# Patient Record
Sex: Female | Born: 1965 | Race: White | Hispanic: No | Marital: Married | Smoking: Current every day smoker
Health system: Southern US, Community
[De-identification: ages and names within clinical notes are randomized; demographics above are authoritative.]

## PROBLEM LIST (undated history)

## (undated) DIAGNOSIS — N2881 Hypertrophy of kidney: Secondary | ICD-10-CM

## (undated) DIAGNOSIS — N200 Calculus of kidney: Secondary | ICD-10-CM

## (undated) HISTORY — PX: OTHER SURGICAL HISTORY: SHX169

---

## 1999-11-27 ENCOUNTER — Encounter: Payer: Self-pay | Admitting: Emergency Medicine

## 1999-11-27 ENCOUNTER — Emergency Department (HOSPITAL_COMMUNITY): Admission: EM | Admit: 1999-11-27 | Discharge: 1999-11-27 | Payer: Self-pay | Admitting: Emergency Medicine

## 2000-05-31 ENCOUNTER — Other Ambulatory Visit: Admission: RE | Admit: 2000-05-31 | Discharge: 2000-05-31 | Payer: Self-pay | Admitting: Unknown Physician Specialty

## 2000-06-04 ENCOUNTER — Encounter: Payer: Self-pay | Admitting: *Deleted

## 2000-06-04 ENCOUNTER — Encounter: Admission: RE | Admit: 2000-06-04 | Discharge: 2000-06-04 | Payer: Self-pay | Admitting: *Deleted

## 2000-07-21 ENCOUNTER — Emergency Department (HOSPITAL_COMMUNITY): Admission: EM | Admit: 2000-07-21 | Discharge: 2000-07-21 | Payer: Self-pay | Admitting: Emergency Medicine

## 2000-08-19 ENCOUNTER — Emergency Department (HOSPITAL_COMMUNITY): Admission: EM | Admit: 2000-08-19 | Discharge: 2000-08-20 | Payer: Self-pay | Admitting: Emergency Medicine

## 2001-09-11 ENCOUNTER — Emergency Department (HOSPITAL_COMMUNITY): Admission: EM | Admit: 2001-09-11 | Discharge: 2001-09-11 | Payer: Self-pay | Admitting: Emergency Medicine

## 2001-09-11 ENCOUNTER — Encounter: Payer: Self-pay | Admitting: Emergency Medicine

## 2002-03-07 ENCOUNTER — Inpatient Hospital Stay (HOSPITAL_COMMUNITY): Admission: AD | Admit: 2002-03-07 | Discharge: 2002-03-07 | Payer: Self-pay | Admitting: *Deleted

## 2004-07-24 ENCOUNTER — Emergency Department (HOSPITAL_COMMUNITY): Admission: EM | Admit: 2004-07-24 | Discharge: 2004-07-24 | Payer: Self-pay | Admitting: Emergency Medicine

## 2004-12-28 ENCOUNTER — Emergency Department (HOSPITAL_COMMUNITY): Admission: EM | Admit: 2004-12-28 | Discharge: 2004-12-28 | Payer: Self-pay | Admitting: Family Medicine

## 2005-12-17 ENCOUNTER — Emergency Department (HOSPITAL_COMMUNITY): Admission: EM | Admit: 2005-12-17 | Discharge: 2005-12-17 | Payer: Self-pay | Admitting: Family Medicine

## 2006-09-17 ENCOUNTER — Emergency Department (HOSPITAL_COMMUNITY): Admission: EM | Admit: 2006-09-17 | Discharge: 2006-09-17 | Payer: Self-pay | Admitting: Emergency Medicine

## 2008-06-20 ENCOUNTER — Emergency Department (HOSPITAL_COMMUNITY): Admission: EM | Admit: 2008-06-20 | Discharge: 2008-06-20 | Payer: Self-pay | Admitting: Emergency Medicine

## 2009-02-09 ENCOUNTER — Emergency Department (HOSPITAL_COMMUNITY): Admission: EM | Admit: 2009-02-09 | Discharge: 2009-02-10 | Payer: Self-pay | Admitting: Emergency Medicine

## 2009-02-18 ENCOUNTER — Ambulatory Visit (HOSPITAL_BASED_OUTPATIENT_CLINIC_OR_DEPARTMENT_OTHER): Admission: RE | Admit: 2009-02-18 | Discharge: 2009-02-18 | Payer: Self-pay | Admitting: Orthopedic Surgery

## 2009-04-06 ENCOUNTER — Encounter: Admission: RE | Admit: 2009-04-06 | Discharge: 2009-05-13 | Payer: Self-pay | Admitting: Orthopedic Surgery

## 2009-05-14 ENCOUNTER — Encounter: Admission: RE | Admit: 2009-05-14 | Discharge: 2009-06-14 | Payer: Self-pay | Admitting: Orthopedic Surgery

## 2009-12-07 ENCOUNTER — Emergency Department (HOSPITAL_COMMUNITY): Admission: EM | Admit: 2009-12-07 | Discharge: 2009-12-07 | Payer: Self-pay | Admitting: Emergency Medicine

## 2010-03-11 ENCOUNTER — Emergency Department (HOSPITAL_COMMUNITY): Admission: EM | Admit: 2010-03-11 | Discharge: 2010-03-11 | Payer: Self-pay | Admitting: Family Medicine

## 2010-11-01 LAB — POCT PREGNANCY, URINE: Preg Test, Ur: NEGATIVE

## 2010-11-01 LAB — POCT I-STAT, CHEM 8: Potassium: 3.5 mEq/L (ref 3.5–5.1)

## 2010-11-21 LAB — CBC
MCV: 90.2 fL (ref 78.0–100.0)
RDW: 13.9 % (ref 11.5–15.5)

## 2010-11-21 LAB — DIFFERENTIAL
Basophils Relative: 1 % (ref 0–1)
Eosinophils Absolute: 0.1 10*3/uL (ref 0.0–0.7)
Lymphocytes Relative: 41 % (ref 12–46)
Monocytes Absolute: 0.3 10*3/uL (ref 0.1–1.0)
Monocytes Relative: 5 % (ref 3–12)

## 2010-11-21 LAB — POCT I-STAT, CHEM 8
BUN: 7 mg/dL (ref 6–23)
Calcium, Ion: 0.98 mmol/L — ABNORMAL LOW (ref 1.12–1.32)
Creatinine, Ser: 0.9 mg/dL (ref 0.4–1.2)
HCT: 37 % (ref 36.0–46.0)
Hemoglobin: 12.6 g/dL (ref 12.0–15.0)
Potassium: 3.2 mEq/L — ABNORMAL LOW (ref 3.5–5.1)
Sodium: 133 mEq/L — ABNORMAL LOW (ref 135–145)
TCO2: 21 mmol/L (ref 0–100)

## 2010-11-21 LAB — TYPE AND SCREEN: Antibody Screen: NEGATIVE

## 2010-12-27 NOTE — Consult Note (Signed)
NAME:  Jeanne Huynh, Jeanne Huynh NO.:  [id number]   MEDICAL RECORD NO.:  [id number]          PATIENT TYPE:  EMS   LOCATION:  MAJO                         FACILITY:  [facility]   PHYSICIAN:  Madelynn Done, MD  DATE OF BIRTH:  [birth year]   DATE OF CONSULTATION:  06/20/2008  DATE OF DISCHARGE:  06/20/2008                                 CONSULTATION   REASON FOR CONSULTATION:  Right distal radius fracture.   REQUESTING PHYSICIAN:  Doug Sou, M.D., Emergency Department   Jeanne Huynh is a 45 year old right-hand-dominant female.  The patient  known to me as I have taken care of her daughter who fell at her  daughter's birthday party while skating and landed on an outstretched  right wrist.  The patient presented to the urgent care, was transferred  to the emergency department for definitive management of her right wrist  fracture.  No other complaints today.   PAST MEDICAL HISTORY:  Anxiety, asthma, and panic attacks.   PAST SURGICAL HISTORY:  No prior wrist surgeries.   SOCIAL HISTORY:  She is a smoker, occasional alcohol, no drug use.  She  is married.  They live in [place].   REVIEW OF SYSTEMS:  No recent illnesses or hospitalizations.   PHYSICAL EXAMINATION:  She is a healthy-appearing female.  Blood  pressure 153/95, heart rate of 77, respirations of 20 with a regular  pulse.  She has a normal gait and station.  Good hand coordination,  normal mood.  She is alert and oriented to person, place, and time in no  acute distress.   On examination of the right wrist, she does have an elevated deformity  to her right wrist.  Her skin is in good condition.  She is able to  extend her thumb A-OK sign across her fingers, extend her digits, extend  her thumb.  Her fingertips are warm and well-perfused, good capillary  refill.  She has limited motion of her wrist and forearm secondary to  the injury.   RADIOGRAPH:  Three views of the wrist did show a displaced and  comminuted distal radius fracture that appears extraarticular with the  apex volar angulation.   PROCEDURE:  After hematoma block was performed with 10 mL of 1%  lidocaine, a closed manipulation was then performed.  The patient was  then placed in a well-padded sugar-tong splint.  This was done using the  finger-trap traction and a closed manipulation was done without any  difficulty.   Postreduction radiographs did show good position of both planes of the  distal radius fracture.  There is good alignment and good restoration of  her radial height inclination and tilt.   IMPRESSION:  Right wrist distal radius fracture, displaced status post  closed manipulation.   PLAN:  The patient is going to continue with a sugar tong splint.  She  is going to see me in the office later on this week for repeat  radiographs in the splint.  If she does not show any displacement, we  will continue with the closed  treatment of this injury; however, further  displacement occurred.  The  patient is likely going to require operative intervention.  All  questions were addressed of the patient.  Today, we did write her a pain  prescription for Vicodin.  She was given a light-duty work note.  We  talked about ice, elevation, and keeping the splint clean and dry.      Madelynn Done, MD  Electronically Signed     FWO/MEDQ  D:  06/20/2008  T:  06/20/2008  Job:  [id number]

## 2010-12-27 NOTE — Op Note (Signed)
NAME:  KATRIANA, DORTCH              ACCOUNT NO.:  [id number]   MEDICAL RECORD NO.:  [id number]          PATIENT TYPE:  AMB   LOCATION:  DSC                          FACILITY:  [facility]   PHYSICIAN:  Mila Homer. Sherlean Foot, M.D. DATE OF BIRTH:  1966/04/23   DATE OF PROCEDURE:  02/18/2009  DATE OF DISCHARGE:                               OPERATIVE REPORT   SURGEON:  Mila Homer. Sherlean Foot, MD   ASSISTANT:  None.   ANESTHESIA:  General.   PREOPERATIVE DIAGNOSIS:  Right ankle trimalleolar fracture.   POSTOPERATIVE DIAGNOSIS:  Right ankle trimalleolar fracture.   PROCEDURE:  Right open reduction and internal fixation of medial and  lateral malleoli.   INDICATIONS FOR PROCEDURE:  The patient is a 45 year old white female,  who fell out of a tree a week ago.  She presented to the emergency room.  I was on an unassigned call.  She was reduced by the ER physician and  sent to my office.  This was the first opportunity to get on the  schedule.  Informed consent was obtained.   DESCRIPTION OF PROCEDURE:  The patient was laid supine and administered  general anesthesia and placed in supine position.  Right leg was prepped  and draped in the usual sterile fashion.  Esmarch was used to  exsanguinate the extremity and elevated the tourniquet 300 mmHg.  I then  made an incision along the lateral border of the fibula.  Blunt  dissection down to the bone and sharp dissection to remove the  periosteum off the bone.  Cleaned out the fracture.  Reduced the  fracture with a tack and with a bone reduction forceps.  I then placed a  lag screw anterior to posterior, 24-mm bicortical screw.  I then removed  the bone reduction forceps and maintained anatomic reduction.  I placed  a 7-hole plate fastened to the lateral malleolus.  Placed 2 distal fully  threaded cancellous screws 14 mm in length.  Approximately, I placed  three 12-mm screws, bicortical and one 14.  I checked under AP and  lateral C-arm images to  ensure anatomic reduction.  I then made a small  2-cm incision over the medial malleolus that was a fracture blister  there, tried to stay posterior to that and went down to the fracture,  placed two 2.0 K-wires, and reduced it anatomically.  I then threaded  out the K-wires for 40-mm partially threaded cancellous screws.  Mortise  was anatomic.  On the lateral, there was trimalleolar piece, which was  certainly less than 25 mm and throughout a range of motion, the ankle  stayed located.  So, I felt that it was not necessary to fix that.  I  then irrigated and closed with 0 and 2-0 Vicryl sutures, skin staples,  Xeroform, Adaptic, 4x4s, ABDs, soft dressing, and an equalizer.  I  wanted her in the equalizer, so we can take down the wound and check  that but she is to stay in the equalizer until I see her back.           ______________________________  Mila Homer.  Sherlean Foot, M.D.    SDL/MEDQ  D:  02/18/2009  T:  02/18/2009  Job:  [id number]

## 2011-01-10 ENCOUNTER — Inpatient Hospital Stay (INDEPENDENT_AMBULATORY_CARE_PROVIDER_SITE_OTHER)
Admission: RE | Admit: 2011-01-10 | Discharge: 2011-01-10 | Disposition: A | Discharge status: Home / Self Care | Payer: Self-pay | Source: Ambulatory Visit | Attending: Family Medicine | Admitting: Family Medicine

## 2011-01-10 DIAGNOSIS — H6120 Impacted cerumen, unspecified ear: Secondary | ICD-10-CM

## 2011-01-10 DIAGNOSIS — H9209 Otalgia, unspecified ear: Secondary | ICD-10-CM

## 2011-01-11 ENCOUNTER — Inpatient Hospital Stay (INDEPENDENT_AMBULATORY_CARE_PROVIDER_SITE_OTHER)
Admission: RE | Admit: 2011-01-11 | Discharge: 2011-01-11 | Disposition: A | Discharge status: Home / Self Care | Payer: Self-pay | Source: Ambulatory Visit | Attending: Family Medicine | Admitting: Family Medicine

## 2011-01-11 DIAGNOSIS — H6120 Impacted cerumen, unspecified ear: Secondary | ICD-10-CM

## 2011-01-13 ENCOUNTER — Inpatient Hospital Stay (HOSPITAL_COMMUNITY)
Admission: RE | Admit: 2011-01-13 | Discharge: 2011-01-13 | Disposition: A | Discharge status: Home / Self Care | Payer: Self-pay | Source: Ambulatory Visit | Attending: Family Medicine | Admitting: Family Medicine

## 2011-05-18 ENCOUNTER — Encounter (HOSPITAL_COMMUNITY): Payer: Self-pay | Admitting: Radiology

## 2011-05-18 ENCOUNTER — Emergency Department (HOSPITAL_COMMUNITY)
Admission: EM | Admit: 2011-05-18 | Discharge: 2011-05-18 | Disposition: A | Discharge status: Home / Self Care | Payer: Self-pay | Attending: Emergency Medicine | Admitting: Emergency Medicine

## 2011-05-18 ENCOUNTER — Emergency Department (HOSPITAL_COMMUNITY): Payer: Self-pay

## 2011-05-18 DIAGNOSIS — Z79899 Other long term (current) drug therapy: Secondary | ICD-10-CM | POA: Insufficient documentation

## 2011-05-18 DIAGNOSIS — R6883 Chills (without fever): Secondary | ICD-10-CM | POA: Insufficient documentation

## 2011-05-18 DIAGNOSIS — N23 Unspecified renal colic: Secondary | ICD-10-CM | POA: Insufficient documentation

## 2011-05-18 DIAGNOSIS — R197 Diarrhea, unspecified: Secondary | ICD-10-CM | POA: Insufficient documentation

## 2011-05-18 DIAGNOSIS — J45909 Unspecified asthma, uncomplicated: Secondary | ICD-10-CM | POA: Insufficient documentation

## 2011-05-18 DIAGNOSIS — R10814 Left lower quadrant abdominal tenderness: Secondary | ICD-10-CM | POA: Insufficient documentation

## 2011-05-18 DIAGNOSIS — R109 Unspecified abdominal pain: Secondary | ICD-10-CM | POA: Insufficient documentation

## 2011-05-18 DIAGNOSIS — N1330 Unspecified hydronephrosis: Secondary | ICD-10-CM | POA: Insufficient documentation

## 2011-05-18 DIAGNOSIS — F411 Generalized anxiety disorder: Secondary | ICD-10-CM | POA: Insufficient documentation

## 2011-05-18 LAB — POCT I-STAT, CHEM 8
Calcium, Ion: 1.14 mmol/L (ref 1.12–1.32)
Glucose, Bld: 95 mg/dL (ref 70–99)
Sodium: 137 mEq/L (ref 135–145)
TCO2: 25 mmol/L (ref 0–100)

## 2011-05-18 LAB — URINALYSIS, ROUTINE W REFLEX MICROSCOPIC
Bilirubin Urine: NEGATIVE
Ketones, ur: NEGATIVE mg/dL
Nitrite: NEGATIVE
Specific Gravity, Urine: 1.006 (ref 1.005–1.030)
pH: 6 (ref 5.0–8.0)

## 2011-05-18 LAB — URINE MICROSCOPIC-ADD ON

## 2011-05-19 LAB — URINE CULTURE
Culture  Setup Time: 201210041713
Culture: NO GROWTH

## 2012-01-19 ENCOUNTER — Encounter (HOSPITAL_COMMUNITY): Payer: Self-pay | Admitting: *Deleted

## 2012-01-19 ENCOUNTER — Emergency Department (INDEPENDENT_AMBULATORY_CARE_PROVIDER_SITE_OTHER)
Admission: EM | Admit: 2012-01-19 | Discharge: 2012-01-19 | Disposition: A | Discharge status: Home / Self Care | Payer: Self-pay | Source: Home / Self Care | Attending: Emergency Medicine | Admitting: Emergency Medicine

## 2012-01-19 DIAGNOSIS — L723 Sebaceous cyst: Secondary | ICD-10-CM

## 2012-01-19 MED ORDER — SULFAMETHOXAZOLE-TRIMETHOPRIM 800-160 MG PO TABS: 1.0000 | ORAL_TABLET | Freq: Two times a day (BID) | ORAL | Status: AC

## 2012-01-19 MED ORDER — OXYCODONE-ACETAMINOPHEN 5-325 MG PO TABS: 2.0000 | ORAL_TABLET | ORAL | Status: AC | PRN

## 2012-01-19 MED ORDER — PROPOXYPHENE N-APAP 100-650 MG PO TABS: 1.0000 | ORAL_TABLET | Freq: Four times a day (QID) | ORAL | Status: DC | PRN

## 2012-01-19 MED ORDER — CYCLOBENZAPRINE HCL 10 MG PO TABS: 10.0000 mg | ORAL_TABLET | Freq: Two times a day (BID) | ORAL | Status: DC | PRN

## 2012-01-19 NOTE — ED Provider Notes (Signed)
History     CSN: [id number]  Arrival date & time 01/19/12  1304   First MD Initiated Contact with Patient 01/19/12 1434      Chief Complaint  Patient presents with  . Facial Swelling    (Consider location/radiation/quality/duration/timing/severity/associated sxs/prior treatment) HPI Comments: Pt frequently gets "lumps" around her ears, usually they go away by themselves.  The lump on L earlobe has been there for several weeks, but in the last couple of days has gotten much bigger and painful.   Patient is a 46 y.o. female presenting with abscess. The history is provided by the patient.  Abscess  This is a new problem. The current episode started yesterday. The problem has been rapidly worsening. The abscess is present on the left ear. The problem is severe. The abscess is characterized by painfulness, redness and swelling. It is unknown what she was exposed to. Pertinent negatives include no fever.    Past Medical History  Diagnosis Date  . Asthma     History reviewed. No pertinent past surgical history.  History reviewed. No pertinent family history.  History  Substance Use Topics  . Smoking status: Current Everyday Smoker  . Smokeless tobacco: Not on file  . Alcohol Use: Yes    OB History    Grav Para Term Preterm Abortions TAB SAB Ect Mult Living                  Review of Systems  Constitutional: Negative for fever and chills.  HENT: Positive for ear pain. Negative for ear discharge.   Skin: Positive for color change. Negative for rash and wound.       Lump L earlobe    Allergies  Aspirin; Codeine; Motrin; and Penicillins  Home Medications   Current Outpatient Rx  Name Route Sig Dispense Refill  . ALPRAZOLAM 1 MG PO TABS Oral Take 1 mg by mouth at bedtime as needed.    Marland Kitchen CITALOPRAM HYDROBROMIDE 10 MG PO TABS Oral Take 10 mg by mouth daily.    . OXYCODONE-ACETAMINOPHEN 5-325 MG PO TABS Oral Take 2 tablets by mouth every 4 (four) hours as needed for pain.  6 tablet 0  . SULFAMETHOXAZOLE-TRIMETHOPRIM 800-160 MG PO TABS Oral Take 1 tablet by mouth every 12 (twelve) hours. 20 tablet 0    BP 135/86  Pulse 72  Temp(Src) 98.3 F (36.8 C) (Oral)  Resp 16  SpO2 100%  Physical Exam  Constitutional: She appears well-developed and well-nourished. No distress.  HENT:  Left Ear: Tympanic membrane and ear canal normal. There is swelling and tenderness.  Ears:       See skin exam  Pulmonary/Chest: Effort normal.  Lymphadenopathy:       Head (right side): No submental, no submandibular, no tonsillar, no preauricular and no posterior auricular adenopathy present.       Head (left side): No submental, no submandibular, no tonsillar, no preauricular and no posterior auricular adenopathy present.  Skin: Skin is warm, dry and intact.       L earlobe as in HEENT exam note.  R preauricular area with small 2-2mm slightly raised nontender mass/cyst c/w early sebaceous cyst.  B earlobes with multiple old scars c/w sinus tracts of sebaceous cysts.     ED Course  INCISION AND DRAINAGE Date/Time: 01/19/2012 2:00 PM Performed by: Cathlyn Parsons Authorized by: Cathlyn Parsons Consent: Verbal consent obtained. Risks and benefits: risks, benefits and alternatives were discussed Consent given by: patient Patient understanding: patient states  understanding of the procedure being performed Patient identity confirmed: verbally with patient and arm band Type: abscess Body area: head/neck Location details: left external ear Anesthesia: local infiltration Local anesthetic: lidocaine 2% without epinephrine Anesthetic total: 1.5 ml Patient sedated: no Scalpel size: 11 Incision type: single straight Complexity: simple Drainage: purulent Drainage amount: copious Wound treatment: wound left open Packing material: 1/4 in iodoform gauze Patient tolerance: Patient tolerated the procedure well with no immediate complications.   (including critical care  time)   Labs Reviewed  CULTURE, ROUTINE-ABSCESS   No results found.   1. Sebaceous cyst       MDM          Cathlyn Parsons, NP 01/19/12 1517

## 2012-01-19 NOTE — Discharge Instructions (Signed)
Use dry heat as we discussed several times a day on future cysts to help them dissolve and to prevent infection.  Finish all of the antibiotics.  Slowly withdraw the ribbon gauze packing in your ear over the next week or so.

## 2012-01-19 NOTE — ED Notes (Signed)
Pt  Has  Swollen  Tender l  Earlobe  -  It  Is  Red   And  painfull  To  The  Touch     She  Reports  Has  Had  The  Symptoms  X  sev  Weeks     She  Is  Sitting  Upright  On   Exam  Table in no  Severe  Distress  Speaking in  Complete  sentances

## 2012-01-21 NOTE — ED Provider Notes (Signed)
Medical screening examination/treatment/procedure(s) were performed by non-physician practitioner and as supervising physician I was immediately available for consultation/collaboration.  Luiz Blare MD   Luiz Blare, MD 01/21/12 2013

## 2012-01-22 LAB — CULTURE, ROUTINE-ABSCESS

## 2012-12-12 ENCOUNTER — Emergency Department (INDEPENDENT_AMBULATORY_CARE_PROVIDER_SITE_OTHER)
Admission: EM | Admit: 2012-12-12 | Discharge: 2012-12-12 | Disposition: A | Discharge status: Home / Self Care | Payer: Self-pay | Source: Home / Self Care

## 2012-12-12 ENCOUNTER — Encounter (HOSPITAL_COMMUNITY): Payer: Self-pay | Admitting: *Deleted

## 2012-12-12 DIAGNOSIS — J302 Other seasonal allergic rhinitis: Secondary | ICD-10-CM

## 2012-12-12 DIAGNOSIS — J449 Chronic obstructive pulmonary disease, unspecified: Secondary | ICD-10-CM

## 2012-12-12 DIAGNOSIS — J309 Allergic rhinitis, unspecified: Secondary | ICD-10-CM

## 2012-12-12 MED ORDER — BUDESONIDE 32 MCG/ACT NA SUSP: 1.0000 | Freq: Two times a day (BID) | NASAL | Status: DC

## 2012-12-12 MED ORDER — TRIAMCINOLONE ACETONIDE 40 MG/ML IJ SUSP
40.0000 mg | Freq: Once | INTRAMUSCULAR | Status: AC
  Administered 2012-12-12: 40 mg via INTRAMUSCULAR

## 2012-12-12 MED ORDER — TRIAMCINOLONE ACETONIDE 40 MG/ML IJ SUSP
INTRAMUSCULAR | Status: AC
  Filled 2012-12-12: qty 5

## 2012-12-12 MED ORDER — ALBUTEROL SULFATE HFA 108 (90 BASE) MCG/ACT IN AERS: 2.0000 | INHALATION_SPRAY | Freq: Four times a day (QID) | RESPIRATORY_TRACT | Status: DC | PRN

## 2012-12-12 MED ORDER — METHYLPREDNISOLONE ACETATE 80 MG/ML IJ SUSP
INTRAMUSCULAR | Status: AC
  Filled 2012-12-12: qty 1

## 2012-12-12 MED ORDER — METHYLPREDNISOLONE ACETATE 40 MG/ML IJ SUSP
80.0000 mg | Freq: Once | INTRAMUSCULAR | Status: AC
  Administered 2012-12-12: 80 mg via INTRAMUSCULAR

## 2012-12-12 MED ORDER — FEXOFENADINE HCL 180 MG PO TABS: 180.0000 mg | ORAL_TABLET | Freq: Every day | ORAL | Status: DC

## 2012-12-12 NOTE — ED Notes (Signed)
Pt  Has  Symptoms  Of  Cough  /  Congested        History  Of  Asthma     - she  Is  Out of  Her  Albuterol           She  Continues  To  Smoke

## 2012-12-12 NOTE — ED Provider Notes (Signed)
History     CSN: [id number]  Arrival date & time 12/12/12  1718   None     Chief Complaint  Patient presents with  . Cough    (Consider location/radiation/quality/duration/timing/severity/associated sxs/prior treatment) Patient is a 47 y.o. female presenting with cough. The history is provided by the patient.  Cough Cough characteristics:  Non-productive and dry Severity:  Moderate Duration:  4 days Progression:  Worsening Chronicity:  New Context: weather changes   Ineffective treatments:  Beta-agonist inhaler Associated symptoms: ear fullness, rhinorrhea, sinus congestion and sore throat   Associated symptoms: no wheezing     Past Medical History  Diagnosis Date  . Asthma     History reviewed. No pertinent past surgical history.  No family history on file.  History  Substance Use Topics  . Smoking status: Current Every Day Smoker  . Smokeless tobacco: Not on file  . Alcohol Use: Yes    OB History   Grav Para Term Preterm Abortions TAB SAB Ect Mult Living                  Review of Systems  Constitutional: Negative.   HENT: Positive for sore throat, rhinorrhea and postnasal drip.   Respiratory: Positive for cough. Negative for wheezing.     Allergies  Aspirin; Codeine; Motrin; and Penicillins  Home Medications   Current Outpatient Rx  Name  Route  Sig  Dispense  Refill  . albuterol (PROVENTIL HFA;VENTOLIN HFA) 108 (90 BASE) MCG/ACT inhaler   Inhalation   Inhale 2 puffs into the lungs every 6 (six) hours as needed for wheezing.   1 Inhaler   1   . ALPRAZolam (XANAX) 1 MG tablet   Oral   Take 1 mg by mouth at bedtime as needed.         . budesonide (RHINOCORT AQUA) 32 MCG/ACT nasal spray   Nasal   Place 1 spray into the nose 2 (two) times daily.   1 Bottle   1   . citalopram (CELEXA) 10 MG tablet   Oral   Take 10 mg by mouth daily.         . fexofenadine (ALLEGRA) 180 MG tablet   Oral   Take 1 tablet (180 mg total) by mouth  daily.   30 tablet   1     BP 130/76  Pulse 72  Temp(Src) 98.6 F (37 C) (Oral)  Resp 18  SpO2 96%  LMP 12/12/2012  Physical Exam  Nursing note and vitals reviewed. Constitutional: She is oriented to person, place, and time. She appears well-developed and well-nourished.  HENT:  Right Ear: External ear normal.  Left Ear: External ear normal.  Mouth/Throat: Oropharynx is clear and moist.  Eyes: Pupils are equal, round, and reactive to light.  Neck: Normal range of motion. Neck supple.  Pulmonary/Chest: Effort normal. She has decreased breath sounds.  Lymphadenopathy:    She has no cervical adenopathy.  Neurological: She is alert and oriented to person, place, and time.  Skin: Skin is warm and dry.    ED Course  Procedures (including critical care time)  Labs Reviewed - No data to display No results found.   1. Seasonal allergic reaction   2. COPD, mild       MDM          Linna Hoff, MD 12/12/12 [phone]

## 2013-06-25 ENCOUNTER — Emergency Department (HOSPITAL_COMMUNITY)
Admission: EM | Admit: 2013-06-25 | Discharge: 2013-06-26 | Disposition: A | Discharge status: Home / Self Care | Payer: Self-pay | Attending: Emergency Medicine | Admitting: Emergency Medicine

## 2013-06-25 ENCOUNTER — Emergency Department (HOSPITAL_COMMUNITY): Payer: Self-pay

## 2013-06-25 ENCOUNTER — Encounter (HOSPITAL_COMMUNITY): Payer: Self-pay | Admitting: Emergency Medicine

## 2013-06-25 DIAGNOSIS — Z79899 Other long term (current) drug therapy: Secondary | ICD-10-CM | POA: Insufficient documentation

## 2013-06-25 DIAGNOSIS — Z3202 Encounter for pregnancy test, result negative: Secondary | ICD-10-CM | POA: Insufficient documentation

## 2013-06-25 DIAGNOSIS — N12 Tubulo-interstitial nephritis, not specified as acute or chronic: Secondary | ICD-10-CM | POA: Insufficient documentation

## 2013-06-25 DIAGNOSIS — Z880 Allergy status to penicillin: Secondary | ICD-10-CM | POA: Insufficient documentation

## 2013-06-25 DIAGNOSIS — IMO0002 Reserved for concepts with insufficient information to code with codable children: Secondary | ICD-10-CM | POA: Insufficient documentation

## 2013-06-25 DIAGNOSIS — F17200 Nicotine dependence, unspecified, uncomplicated: Secondary | ICD-10-CM | POA: Insufficient documentation

## 2013-06-25 DIAGNOSIS — J45909 Unspecified asthma, uncomplicated: Secondary | ICD-10-CM | POA: Insufficient documentation

## 2013-06-25 DIAGNOSIS — Z87442 Personal history of urinary calculi: Secondary | ICD-10-CM | POA: Insufficient documentation

## 2013-06-25 HISTORY — DX: Calculus of kidney: N20.0

## 2013-06-25 LAB — BASIC METABOLIC PANEL
Calcium: 9.2 mg/dL (ref 8.4–10.5)
GFR calc non Af Amer: 81 mL/min — ABNORMAL LOW (ref >90)
Sodium: 132 mEq/L — ABNORMAL LOW (ref 135–145)

## 2013-06-25 LAB — URINALYSIS, ROUTINE W REFLEX MICROSCOPIC
Bilirubin Urine: NEGATIVE
Ketones, ur: NEGATIVE mg/dL
Nitrite: NEGATIVE
pH: 6.5 (ref 5.0–8.0)

## 2013-06-25 LAB — CBC WITH DIFFERENTIAL/PLATELET
Basophils Absolute: 0 10*3/uL (ref 0.0–0.1)
Basophils Relative: 0 % (ref 0–1)
Eosinophils Absolute: 0 10*3/uL (ref 0.0–0.7)
Eosinophils Relative: 0 % (ref 0–5)
MCH: 32.7 pg (ref 26.0–34.0)
MCHC: 35 g/dL (ref 30.0–36.0)
MCV: 93.5 fL (ref 78.0–100.0)
Platelets: 176 10*3/uL (ref 150–400)
RDW: 12.6 % (ref 11.5–15.5)
WBC: 10.2 10*3/uL (ref 4.0–10.5)

## 2013-06-25 LAB — URINE MICROSCOPIC-ADD ON

## 2013-06-25 LAB — POCT PREGNANCY, URINE: Preg Test, Ur: NEGATIVE

## 2013-06-25 MED ORDER — DEXTROSE 5 % IV SOLN
1.0000 g | Freq: Once | INTRAVENOUS | Status: AC
  Administered 2013-06-25: 1 g via INTRAVENOUS
  Filled 2013-06-25: qty 10

## 2013-06-25 MED ORDER — ONDANSETRON HCL 4 MG/2ML IJ SOLN
4.0000 mg | Freq: Once | INTRAMUSCULAR | Status: AC
  Administered 2013-06-25: 4 mg via INTRAVENOUS
  Filled 2013-06-25: qty 2

## 2013-06-25 MED ORDER — OXYCODONE-ACETAMINOPHEN 5-325 MG PO TABS
1.0000 | ORAL_TABLET | Freq: Once | ORAL | Status: AC
  Administered 2013-06-25: 1 via ORAL
  Filled 2013-06-25: qty 1

## 2013-06-25 MED ORDER — OXYCODONE-ACETAMINOPHEN 5-325 MG PO TABS: 1.0000 | ORAL_TABLET | ORAL | Status: DC | PRN

## 2013-06-25 MED ORDER — SODIUM CHLORIDE 0.9 % IV BOLUS (SEPSIS)
1000.0000 mL | INTRAVENOUS | Status: AC
  Administered 2013-06-25: 1000 mL via INTRAVENOUS

## 2013-06-25 MED ORDER — CEPHALEXIN 500 MG PO CAPS: 500.0000 mg | ORAL_CAPSULE | Freq: Four times a day (QID) | ORAL | Status: AC

## 2013-06-25 MED ORDER — HYDROMORPHONE HCL PF 1 MG/ML IJ SOLN
0.5000 mg | Freq: Once | INTRAMUSCULAR | Status: AC
  Administered 2013-06-25: 0.5 mg via INTRAVENOUS
  Filled 2013-06-25: qty 1

## 2013-06-25 NOTE — ED Notes (Signed)
Pt c/o left flank pain x 4 days; pt sts hx of kidney stone and feels similar; pt sts pressure with urination

## 2013-06-25 NOTE — ED Provider Notes (Signed)
CSN: [id number]     Arrival date & time 06/25/13  1457 History   First MD Initiated Contact with Patient 06/25/13 1841     Chief Complaint  Patient presents with  . Flank Pain   (Consider location/radiation/quality/duration/timing/severity/associated sxs/prior Treatment) Patient is a 47 y.o. female presenting with flank pain. The history is provided by the patient.  Flank Pain This is a new problem. Episode onset: 1 week ago. The problem occurs constantly. The problem has been gradually worsening. Pertinent negatives include no chest pain, no abdominal pain, no headaches and no shortness of breath. Nothing aggravates the symptoms. Nothing relieves the symptoms. She has tried nothing for the symptoms. The treatment provided no relief.    Past Medical History  Diagnosis Date  . Asthma   . Kidney stone    History reviewed. No pertinent past surgical history. History reviewed. No pertinent family history. History  Substance Use Topics  . Smoking status: Current Every Day Smoker  . Smokeless tobacco: Not on file  . Alcohol Use: Yes   OB History   Grav Para Term Preterm Abortions TAB SAB Ect Mult Living                 Review of Systems  Constitutional: Negative for fever and fatigue.  HENT: Negative for congestion and drooling.   Eyes: Negative for pain.  Respiratory: Negative for cough and shortness of breath.   Cardiovascular: Negative for chest pain.  Gastrointestinal: Negative for nausea, vomiting, abdominal pain and diarrhea.  Genitourinary: Positive for dysuria and flank pain. Negative for hematuria.  Musculoskeletal: Negative for back pain, gait problem and neck pain.  Skin: Negative for color change.  Neurological: Negative for dizziness and headaches.  Hematological: Negative for adenopathy.  Psychiatric/Behavioral: Negative for behavioral problems.  All other systems reviewed and are negative.    Allergies  Aspirin; Codeine; Motrin; and Penicillins  Home  Medications   Current Outpatient Rx  Name  Route  Sig  Dispense  Refill  . albuterol (PROVENTIL HFA;VENTOLIN HFA) 108 (90 BASE) MCG/ACT inhaler   Inhalation   Inhale 2 puffs into the lungs every 6 (six) hours as needed for wheezing.   1 Inhaler   1   . ALPRAZolam (XANAX) 1 MG tablet   Oral   Take 1 mg by mouth at bedtime as needed.         . budesonide (RHINOCORT AQUA) 32 MCG/ACT nasal spray   Nasal   Place 1 spray into the nose 2 (two) times daily.   1 Bottle   1   . citalopram (CELEXA) 10 MG tablet   Oral   Take 10 mg by mouth daily.         . fexofenadine (ALLEGRA) 180 MG tablet   Oral   Take 1 tablet (180 mg total) by mouth daily.   30 tablet   1    BP 117/81  Pulse 99  Temp(Src) 99 F (37.2 C) (Oral)  Resp 18  SpO2 98% Physical Exam  Nursing note and vitals reviewed. Constitutional: She is oriented to person, place, and time. She appears well-developed and well-nourished.  HENT:  Head: Normocephalic.  Mouth/Throat: Oropharynx is clear and moist. No oropharyngeal exudate.  Eyes: Conjunctivae and EOM are normal. Pupils are equal, round, and reactive to light.  Neck: Normal range of motion. Neck supple.  Cardiovascular: Regular rhythm, normal heart sounds and intact distal pulses.  Exam reveals no gallop and no friction rub.   No murmur heard. HR  110  Pulmonary/Chest: Effort normal and breath sounds normal. No respiratory distress. She has no wheezes.  Abdominal: Soft. Bowel sounds are normal. There is no tenderness. There is no rebound and no guarding.  Musculoskeletal: Normal range of motion. She exhibits tenderness (mild left CVA ttp). She exhibits no edema.  Neurological: She is alert and oriented to person, place, and time.  Skin: Skin is warm and dry.  Psychiatric: She has a normal mood and affect. Her behavior is normal.    ED Course  Procedures (including critical care time) Labs Review Labs Reviewed  URINALYSIS, ROUTINE W REFLEX MICROSCOPIC -  Abnormal; Notable for the following:    APPearance CLOUDY (*)    Hgb urine dipstick LARGE (*)    Protein, ur >300 (*)    Leukocytes, UA LARGE (*)    All other components within normal limits  URINE MICROSCOPIC-ADD ON - Abnormal; Notable for the following:    Bacteria, UA FEW (*)    All other components within normal limits  CBC WITH DIFFERENTIAL - Abnormal; Notable for the following:    Neutrophils Relative % 85 (*)    Neutro Abs 8.7 (*)    Lymphocytes Relative 8 (*)    All other components within normal limits  BASIC METABOLIC PANEL - Abnormal; Notable for the following:    Sodium 132 (*)    Chloride 94 (*)    GFR calc non Af Amer 81 (*)    All other components within normal limits  URINE CULTURE  POCT PREGNANCY, URINE   Imaging Review Ct Abdomen Pelvis Wo Contrast  06/25/2013   CLINICAL DATA:  Left flank pain. History of kidney stones per patient.  EXAM: CT ABDOMEN AND PELVIS WITHOUT CONTRAST  TECHNIQUE: Multidetector CT imaging of the abdomen and pelvis was performed following the standard protocol without intravenous contrast.  COMPARISON:  CT stone study 05/18/2011  FINDINGS: The imaged lung bases are clear. There is an asymmetrically dilated, patulous appearing left renal pelvis. The urothelial discuss that the wall of the left renal pelvis appears diffusely and mildly thickened. There is a abnormal and asymmetric left perinephric stranding, most prominent adjacent to the left renal pelvis and adjacent to the inferior pole of the left kidney. No renal calculus is seen on the left.  No evidence of left ureteral dilatation. No left ureteral stone is seen. Several calcifications in the upper left pelvis are unchanged from a prior study and likely reflect phleboliths.  The right kidney has a normal appearance on noncontrast imaging. No renal calculi are seen. The right ureter is normal in caliber.  Urinary bladder is decompressed.  No bladder stone is seen.  The noncontrast appearance of  the liver, gallbladder, spleen, adrenal glands, and pancreas is within normal limits. The abdominal aorta is normal in caliber and contains scattered atherosclerotic calcification. There is atherosclerotic calcification of the iliac vasculature.  Bowel loops are normal in caliber. The uterus is retroverted. No adnexal mass is identified. There is a small amount of free fluid in the right aspect of the pelvis on image number 59.  Uterus is retroverted. There is a small amount of free fluid in the right aspect of the pelvis. No adnexal mass.  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  No acute bony abnormality.  IMPRESSION: 1. CT findings of most suggestive of left-sided urinary tract infection. There is perinephric stranding adjacent to the inferior pole of the left kidney and the wall of patient's dilated left renal pelvis  appears somewhat thickened likely secondary to urinary tract infection. 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 Please note that on the CT abdomen pelvis of 2012, the left renal pelvis was massively distended, with associated hydronephrosis, consistent with UPJ obstruction. If the patient has not been evaluated by Urology for this condition, then follow-up urology consultation would be suggested. 2. No urinary tract calculi identified. 3. Aortoiliac atherosclerosis without aneurysm. 4. Small amount of free fluid in the pelvis is likely physiologic given patient age.   Electronically Signed   By: Britta Mccreedy M.D.   On: 06/25/2013 20:22    EKG Interpretation   None       MDM   1. Pyelonephritis    7:02 PM 47 y.o. female who presents with left flank pain for approximately one week. The patient states she has a history of kidney stone and this feels similar to that. She denies any fevers, vomiting,  or diarrhea. She has had some suprapubic pressure and dysuria. She is afebrile and mildly tachycardic here. She got a Percocet prior to being evaluated in her pain is currently a 4/10. She refuses any more pain medicine at this time. Will get lab work and CT.  10:57 PM: CT c/w UTI, likely pyelonephritis. Gave rocephin IV here. Will send home on keflex.  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 We also discussed returning to the ED immediately if new or worsening sx occur. We discussed the sx which are most concerning (e.g., inability to tol po abx, worsening pain) that necessitate immediate return. Any new prescriptions provided to the patient are listed below.  Discharge Medication List as of 06/25/2013 11:07 PM    START taking these medications   Details  cephALEXin (KEFLEX) 500 MG capsule Take 1 capsule (500 mg total) by mouth 4 (four) times daily., Starting 06/25/2013, Last dose on Sat 07/05/13, Print    oxyCODONE-acetaminophen (PERCOCET) 5-325 MG per tablet Take 1 tablet by mouth every 4 (four) hours as needed., Starting 06/25/2013, Until Discontinued, Print         Junius Argyle, MD 06/26/13 [phone]

## 2013-06-26 NOTE — ED Notes (Signed)
Pt discharged with family.

## 2013-06-27 LAB — URINE CULTURE

## 2013-06-29 ENCOUNTER — Telehealth (HOSPITAL_COMMUNITY): Payer: Self-pay | Admitting: Emergency Medicine

## 2013-06-29 NOTE — ED Notes (Signed)
Post ED Visit - Positive Culture Follow-up  Culture report reviewed by antimicrobial stewardship pharmacist: []  Wes Dulaney, Pharm.D., BCPS []  Celedonio Miyamoto, Pharm.D., BCPS []  Georgina Pillion, Pharm.D., BCPS []  [place], [address].D., BCPS, AAHIVP []  Estella Husk, Pharm.D., BCPS, AAHIVP [x]  Okey Regal, Pharm.D., BCPS  Positive urine culture Treated with Keflex, organism sensitive to the same and no further patient follow-up is required at this time.  Jeanne Huynh 06/29/2013, 1:24 PM

## 2013-07-08 ENCOUNTER — Other Ambulatory Visit (HOSPITAL_COMMUNITY): Payer: Self-pay | Admitting: Urology

## 2013-07-08 DIAGNOSIS — N12 Tubulo-interstitial nephritis, not specified as acute or chronic: Secondary | ICD-10-CM

## 2013-08-22 ENCOUNTER — Encounter (HOSPITAL_COMMUNITY): Payer: Self-pay

## 2013-09-03 ENCOUNTER — Ambulatory Visit (HOSPITAL_COMMUNITY)
Admission: RE | Admit: 2013-09-03 | Discharge: 2013-09-03 | Disposition: A | Discharge status: Home / Self Care | Payer: Self-pay | Source: Ambulatory Visit | Attending: Urology | Admitting: Urology

## 2013-09-03 DIAGNOSIS — N289 Disorder of kidney and ureter, unspecified: Secondary | ICD-10-CM | POA: Insufficient documentation

## 2013-09-03 DIAGNOSIS — N12 Tubulo-interstitial nephritis, not specified as acute or chronic: Secondary | ICD-10-CM

## 2013-09-03 DIAGNOSIS — N390 Urinary tract infection, site not specified: Secondary | ICD-10-CM | POA: Insufficient documentation

## 2013-09-03 MED ORDER — TECHNETIUM TC 99M MERTIATIDE
16.0000 | Freq: Once | INTRAVENOUS | Status: AC | PRN
  Administered 2013-09-03: 16 via INTRAVENOUS

## 2013-09-03 MED ORDER — FUROSEMIDE 10 MG/ML IJ SOLN
40.0000 mg | Freq: Once | INTRAMUSCULAR | Status: AC
  Administered 2013-09-03: 25 mg via INTRAVENOUS
  Filled 2013-09-03: qty 4

## 2014-03-05 ENCOUNTER — Other Ambulatory Visit (HOSPITAL_COMMUNITY): Payer: Self-pay | Admitting: Urology

## 2014-03-05 DIAGNOSIS — I209 Angina pectoris, unspecified: Secondary | ICD-10-CM

## 2014-03-05 DIAGNOSIS — Z951 Presence of aortocoronary bypass graft: Principal | ICD-10-CM

## 2014-03-06 ENCOUNTER — Ambulatory Visit (HOSPITAL_COMMUNITY)
Admission: RE | Admit: 2014-03-06 | Discharge: 2014-03-06 | Disposition: A | Discharge status: Home / Self Care | Payer: Self-pay | Source: Ambulatory Visit | Attending: Urology | Admitting: Urology

## 2014-03-06 DIAGNOSIS — I209 Angina pectoris, unspecified: Secondary | ICD-10-CM

## 2014-03-06 DIAGNOSIS — Z951 Presence of aortocoronary bypass graft: Secondary | ICD-10-CM

## 2014-03-06 DIAGNOSIS — Q6239 Other obstructive defects of renal pelvis and ureter: Secondary | ICD-10-CM | POA: Insufficient documentation

## 2014-03-25 ENCOUNTER — Other Ambulatory Visit (HOSPITAL_COMMUNITY): Payer: Self-pay | Admitting: Urology

## 2014-03-25 DIAGNOSIS — Q6211 Congenital occlusion of ureteropelvic junction: Principal | ICD-10-CM

## 2014-03-25 DIAGNOSIS — Q6239 Other obstructive defects of renal pelvis and ureter: Secondary | ICD-10-CM

## 2014-03-30 ENCOUNTER — Encounter (HOSPITAL_COMMUNITY): Payer: Self-pay | Admitting: Emergency Medicine

## 2014-03-30 ENCOUNTER — Emergency Department (HOSPITAL_COMMUNITY): Payer: Medicaid Other

## 2014-03-30 ENCOUNTER — Emergency Department (HOSPITAL_COMMUNITY)
Admission: EM | Admit: 2014-03-30 | Discharge: 2014-03-30 | Disposition: A | Discharge status: Home / Self Care | Payer: Medicaid Other | Attending: Emergency Medicine | Admitting: Emergency Medicine

## 2014-03-30 DIAGNOSIS — Z3202 Encounter for pregnancy test, result negative: Secondary | ICD-10-CM | POA: Diagnosis not present

## 2014-03-30 DIAGNOSIS — F17200 Nicotine dependence, unspecified, uncomplicated: Secondary | ICD-10-CM | POA: Insufficient documentation

## 2014-03-30 DIAGNOSIS — J45909 Unspecified asthma, uncomplicated: Secondary | ICD-10-CM | POA: Insufficient documentation

## 2014-03-30 DIAGNOSIS — Z87442 Personal history of urinary calculi: Secondary | ICD-10-CM | POA: Diagnosis not present

## 2014-03-30 DIAGNOSIS — H60399 Other infective otitis externa, unspecified ear: Secondary | ICD-10-CM | POA: Diagnosis not present

## 2014-03-30 DIAGNOSIS — R0789 Other chest pain: Secondary | ICD-10-CM

## 2014-03-30 DIAGNOSIS — Z79899 Other long term (current) drug therapy: Secondary | ICD-10-CM | POA: Insufficient documentation

## 2014-03-30 DIAGNOSIS — H9209 Otalgia, unspecified ear: Secondary | ICD-10-CM | POA: Insufficient documentation

## 2014-03-30 DIAGNOSIS — Z880 Allergy status to penicillin: Secondary | ICD-10-CM | POA: Insufficient documentation

## 2014-03-30 DIAGNOSIS — L0291 Cutaneous abscess, unspecified: Secondary | ICD-10-CM

## 2014-03-30 LAB — COMPREHENSIVE METABOLIC PANEL
ALK PHOS: 76 U/L (ref 39–117)
ALT: 12 U/L (ref 0–35)
AST: 20 U/L (ref 0–37)
Albumin: 4 g/dL (ref 3.5–5.2)
Anion gap: 13 (ref 5–15)
BUN: 13 mg/dL (ref 6–23)
CALCIUM: 9.8 mg/dL (ref 8.4–10.5)
CO2: 29 meq/L (ref 19–32)
Chloride: 105 mEq/L (ref 96–112)
Creatinine, Ser: 0.75 mg/dL (ref 0.50–1.10)
GLUCOSE: 119 mg/dL — AB (ref 70–99)
POTASSIUM: 4 meq/L (ref 3.7–5.3)
Sodium: 147 mEq/L (ref 137–147)
Total Bilirubin: 0.3 mg/dL (ref 0.3–1.2)
Total Protein: 7.1 g/dL (ref 6.0–8.3)

## 2014-03-30 LAB — URINALYSIS, ROUTINE W REFLEX MICROSCOPIC
BILIRUBIN URINE: NEGATIVE
Glucose, UA: NEGATIVE mg/dL
KETONES UR: NEGATIVE mg/dL
Leukocytes, UA: NEGATIVE
Nitrite: NEGATIVE
Protein, ur: NEGATIVE mg/dL
Specific Gravity, Urine: 1.018 (ref 1.005–1.030)
Urobilinogen, UA: 0.2 mg/dL (ref 0.0–1.0)
pH: 7 (ref 5.0–8.0)

## 2014-03-30 LAB — CBC
HCT: 43.5 % (ref 36.0–46.0)
Hemoglobin: 14.9 g/dL (ref 12.0–15.0)
MCH: 32 pg (ref 26.0–34.0)
MCHC: 34.3 g/dL (ref 30.0–36.0)
MCV: 93.5 fL (ref 78.0–100.0)
PLATELETS: 233 10*3/uL (ref 150–400)
RBC: 4.65 MIL/uL (ref 3.87–5.11)
RDW: 12.9 % (ref 11.5–15.5)
WBC: 8.4 10*3/uL (ref 4.0–10.5)

## 2014-03-30 LAB — URINE MICROSCOPIC-ADD ON

## 2014-03-30 LAB — POC URINE PREG, ED: Preg Test, Ur: NEGATIVE

## 2014-03-30 LAB — I-STAT TROPONIN, ED: Troponin i, poc: 0 ng/mL (ref 0.00–0.08)

## 2014-03-30 NOTE — ED Notes (Signed)
EDP at bedside  

## 2014-03-30 NOTE — ED Provider Notes (Signed)
CSN: [id number]     Arrival date & time 03/30/14  1339 History   First MD Initiated Contact with Patient 03/30/14 1646     Chief Complaint  Patient presents with  . Otalgia  . Chest Pain     (Consider location/radiation/quality/duration/timing/severity/associated sxs/prior Treatment) Patient is a 48 y.o. female presenting with ear pain and chest pain. The history is provided by the patient.  Otalgia Location:  Right Behind ear:  No abnormality Quality:  Aching Severity:  Mild Onset quality:  Gradual Duration:  1 day Timing:  Constant Progression:  Unchanged Chronicity:  Recurrent Context comment:  Recurrent cysts/abscess in pre-auricular area Relieved by:  Nothing Worsened by:  Nothing tried Ineffective treatments:  None tried Associated symptoms: cough   Associated symptoms: no abdominal pain, no congestion, no diarrhea, no fever, no headaches, no neck pain and no vomiting   Chest Pain Pain location:  Substernal area Pain quality: aching   Pain radiates to:  Does not radiate Pain radiates to the back: no   Pain severity:  Mild Onset quality:  Gradual Duration: a few minutes. Timing:  Intermittent Progression:  Unchanged Chronicity:  New Context comment:  Worse w/ coughing Relieved by:  Nothing Worsened by:  Nothing tried Ineffective treatments:  None tried Associated symptoms: cough   Associated symptoms: no abdominal pain, no back pain, no dizziness, no fatigue, no fever, no headache, no nausea, no shortness of breath and not vomiting   Cough:    Cough characteristics:  Productive   Sputum characteristics:  Clear   Severity:  Mild   Onset quality:  Gradual   Duration:  1 month   Timing:  Constant   Progression:  Improving   Past Medical History  Diagnosis Date  . Asthma   . Kidney stone    History reviewed. No pertinent past surgical history. No family history on file. History  Substance Use Topics  . Smoking status: Current Every Day Smoker  .  Smokeless tobacco: Not on file  . Alcohol Use: Yes   OB History   Grav Para Term Preterm Abortions TAB SAB Ect Mult Living                 Review of Systems  Constitutional: Negative for fever and fatigue.  HENT: Positive for ear pain. Negative for congestion and drooling.   Eyes: Negative for pain.  Respiratory: Positive for cough. Negative for shortness of breath.   Cardiovascular: Positive for chest pain.  Gastrointestinal: Negative for nausea, vomiting, abdominal pain and diarrhea.  Genitourinary: Negative for dysuria and hematuria.  Musculoskeletal: Negative for back pain, gait problem and neck pain.  Skin: Negative for color change.  Neurological: Negative for dizziness and headaches.  Hematological: Negative for adenopathy.  Psychiatric/Behavioral: Negative for behavioral problems.  All other systems reviewed and are negative.     Allergies  Aspirin; Codeine; Motrin; and Penicillins  Home Medications   Prior to Admission medications   Medication Sig Start Date End Date Taking? Authorizing Provider  albuterol (PROVENTIL HFA;VENTOLIN HFA) 108 (90 BASE) MCG/ACT inhaler Inhale 2 puffs into the lungs every 6 (six) hours as needed for wheezing. 12/12/12  Yes Linna HoffJames D Kindl, MD  ALPRAZolam Prudy Feeler(XANAX) 1 MG tablet Take 1 mg by mouth 2 (two) times daily as needed for anxiety.    Yes Historical Provider, MD  citalopram (CELEXA) 10 MG tablet Take 10 mg by mouth at bedtime.    Yes Historical Provider, MD  ibuprofen (ADVIL,MOTRIN) 200 MG tablet Take 500  mg by mouth daily as needed for moderate pain.    Yes Historical Provider, MD   BP 114/77  Pulse 104  Temp(Src) 98.5 F (36.9 C) (Oral)  Resp 18  SpO2 98% Physical Exam  Nursing note and vitals reviewed. Constitutional: She is oriented to person, place, and time. She appears well-developed and well-nourished.  HENT:  Head: Normocephalic.  Mouth/Throat: Oropharynx is clear and moist. No oropharyngeal exudate.  Small abscess noted  in the right pre-auricular area.   Normal appearing TM's bilaterally.   No mastoid ttp.     Eyes: Conjunctivae and EOM are normal. Pupils are equal, round, and reactive to light.  Neck: Normal range of motion. Neck supple.  Cardiovascular: Normal rate, regular rhythm, normal heart sounds and intact distal pulses.  Exam reveals no gallop and no friction rub.   No murmur heard. Pulmonary/Chest: Effort normal and breath sounds normal. No respiratory distress. She has no wheezes.  Abdominal: Soft. Bowel sounds are normal. There is no tenderness. There is no rebound and no guarding.  Musculoskeletal: Normal range of motion. She exhibits no edema and no tenderness.  Neurological: She is alert and oriented to person, place, and time.  Skin: Skin is warm and dry.  Psychiatric: She has a normal mood and affect. Her behavior is normal.    ED Course  INCISION AND DRAINAGE Date/Time: 03/31/2014 1:32 PM Performed by: Purvis Sheffield Authorized by: Purvis Sheffield Consent: Verbal consent obtained. written consent obtained. Risks and benefits: risks, benefits and alternatives were discussed Consent given by: patient Patient understanding: patient states understanding of the procedure being performed Patient consent: the patient's understanding of the procedure matches consent given Procedure consent: procedure consent matches procedure scheduled Relevant documents: relevant documents present and verified Site marked: the operative site was marked Required items: required blood products, implants, devices, and special equipment available Patient identity confirmed: verbally with patient, arm band, provided demographic data and hospital-assigned identification number Time out: Immediately prior to procedure a "time out" was called to verify the correct patient, procedure, equipment, support staff and site/side marked as required. Type: abscess Location: right pre-auricular area. Anesthesia:  local infiltration Local anesthetic: lidocaine 1% with epinephrine Anesthetic total: 2 ml Patient sedated: no Scalpel size: 11 Incision type: single straight Complexity: simple Drainage: purulent Drainage amount: moderate Wound treatment: wound left open Patient tolerance: Patient tolerated the procedure well with no immediate complications.   (including critical care time) Labs Review Labs Reviewed  COMPREHENSIVE METABOLIC PANEL - Abnormal; Notable for the following:    Glucose, Bld 119 (*)    All other components within normal limits  URINALYSIS, ROUTINE W REFLEX MICROSCOPIC - Abnormal; Notable for the following:    Hgb urine dipstick TRACE (*)    All other components within normal limits  URINE MICROSCOPIC-ADD ON - Abnormal; Notable for the following:    Squamous Epithelial / LPF FEW (*)    All other components within normal limits  CBC  I-STAT TROPOININ, ED  POC URINE PREG, ED    Imaging Review Dg Chest 2 View  03/30/2014   CLINICAL DATA:  Cough with congestion.  Asthma.  Smoker.  EXAM: CHEST  2 VIEW  COMPARISON:  Chest radiograph 09/17/2006.  FINDINGS: Moderate hyperinflation. Normal cardiomediastinal silhouette. No infiltrates or failure. No visible nodules. Increasing hyperinflation compared with priors. No osseous lesions.  IMPRESSION: No active cardiopulmonary disease.  Hyperinflated lungs.   Electronically Signed   By: Davonna Belling M.D.   On: 03/30/2014 15:09  EKG Interpretation   Date/Time:  Monday March 30 2014 13:48:30 EDT Ventricular Rate:  104 PR Interval:  132 QRS Duration: 68 QT Interval:  322 QTC Calculation: 423 R Axis:   92 Text Interpretation:  Sinus tachycardia Rightward axis Cannot rule out  Anterior infarct , age undetermined No significant change since last  tracing Confirmed by Rylei Masella  MD, Zaydrian Batta ([id number]) on 03/30/2014 4:58:00 PM        MDM   Final diagnoses:  Abscess  Other chest pain    5:04 PM 48 y.o. female w hx of asthma,  FH of heart dis, smoker who pw multiple complaints. She states that she developed a cystic-like structure in her right preauricular area several days ago which has worsened. She has had aching pain in the right ear and mild hearing loss since then. She has a history of a left preauricular cyst which was I/D by an ear nose and throat specialist. She denies any fevers. She appears to have a small abscess in the right preauricular area likely an infected cyst. She also notes a cough since July and productive of clear sputum. She states that she has had some intermittent central chest aching which lasts minutes at a time. This began to 3 days ago. Usually has one to 2 episodes per day and last had episode of cp this morning. She is currently asymptomatic as far as this is concerned. Screening labs and imaging are noncontributory. Will I/D abscess.  6:05 PM: I interpreted/reviewed the labs and/or imaging which were non-contributory. Low risk for MACE per HEART score.  I have discussed the diagnosis/risks/treatment options with the patient and believe the pt to be eligible for discharge home to follow-up with her ENT as needed, pcp for further cp workup. We also discussed returning to the ED immediately if new or worsening sx occur. We discussed the sx which are most concerning (e.g., return of cp or worsening of cp, concern for ongoing infection of abscess, fever, sob) that necessitate immediate return. Medications administered to the patient during their visit and any new prescriptions provided to the patient are listed below.  Medications given during this visit Medications - No data to display  Discharge Medication List as of 03/30/2014  6:09 PM       Purvis Sheffield, MD 03/31/14 1335

## 2014-03-30 NOTE — Discharge Instructions (Signed)

## 2014-03-30 NOTE — ED Notes (Addendum)
Pt arrives ambul;aTORY TO triage w/ big drink in her hand ,States caught a cold and has had congestion  Since 2nd week of July coughing up a lot of stuff and has bump/ cysyt  on her rt ear that she needs looked  States has had this for along time but it is getting bigger and she is having trouble hearing now and she wants to make sure she is not preg before tests

## 2014-04-06 ENCOUNTER — Ambulatory Visit (HOSPITAL_COMMUNITY)
Admission: RE | Admit: 2014-04-06 | Discharge: 2014-04-06 | Disposition: A | Discharge status: Home / Self Care | Payer: Medicaid Other | Source: Ambulatory Visit | Attending: Urology | Admitting: Urology

## 2014-04-06 ENCOUNTER — Encounter (HOSPITAL_COMMUNITY): Payer: Self-pay

## 2014-04-06 DIAGNOSIS — Q6239 Other obstructive defects of renal pelvis and ureter: Secondary | ICD-10-CM | POA: Insufficient documentation

## 2014-04-06 DIAGNOSIS — Q6211 Congenital occlusion of ureteropelvic junction: Principal | ICD-10-CM

## 2014-04-06 MED ORDER — IOHEXOL 300 MG/ML  SOLN
100.0000 mL | Freq: Once | INTRAMUSCULAR | Status: AC | PRN
  Administered 2014-04-06: 100 mL via INTRAVENOUS

## 2015-01-13 ENCOUNTER — Encounter (HOSPITAL_COMMUNITY): Payer: Self-pay | Admitting: *Deleted

## 2015-01-13 ENCOUNTER — Emergency Department (HOSPITAL_COMMUNITY)
Admission: EM | Admit: 2015-01-13 | Discharge: 2015-01-13 | Disposition: A | Discharge status: Home / Self Care | Payer: Medicaid Other | Attending: Emergency Medicine | Admitting: Emergency Medicine

## 2015-01-13 ENCOUNTER — Emergency Department (HOSPITAL_COMMUNITY): Payer: Medicaid Other

## 2015-01-13 DIAGNOSIS — R509 Fever, unspecified: Secondary | ICD-10-CM

## 2015-01-13 DIAGNOSIS — J159 Unspecified bacterial pneumonia: Secondary | ICD-10-CM | POA: Insufficient documentation

## 2015-01-13 DIAGNOSIS — J45901 Unspecified asthma with (acute) exacerbation: Secondary | ICD-10-CM | POA: Diagnosis not present

## 2015-01-13 DIAGNOSIS — E860 Dehydration: Secondary | ICD-10-CM | POA: Diagnosis not present

## 2015-01-13 DIAGNOSIS — Z79899 Other long term (current) drug therapy: Secondary | ICD-10-CM | POA: Diagnosis not present

## 2015-01-13 DIAGNOSIS — Z880 Allergy status to penicillin: Secondary | ICD-10-CM | POA: Insufficient documentation

## 2015-01-13 DIAGNOSIS — R52 Pain, unspecified: Secondary | ICD-10-CM | POA: Diagnosis present

## 2015-01-13 DIAGNOSIS — J189 Pneumonia, unspecified organism: Secondary | ICD-10-CM

## 2015-01-13 DIAGNOSIS — Z3202 Encounter for pregnancy test, result negative: Secondary | ICD-10-CM | POA: Insufficient documentation

## 2015-01-13 DIAGNOSIS — Z87442 Personal history of urinary calculi: Secondary | ICD-10-CM | POA: Insufficient documentation

## 2015-01-13 DIAGNOSIS — Z720 Tobacco use: Secondary | ICD-10-CM | POA: Insufficient documentation

## 2015-01-13 LAB — CBC WITH DIFFERENTIAL/PLATELET
BASOS ABS: 0 10*3/uL (ref 0.0–0.1)
BASOS PCT: 0 % (ref 0–1)
EOS ABS: 0 10*3/uL (ref 0.0–0.7)
EOS PCT: 0 % (ref 0–5)
HCT: 39.3 % (ref 36.0–46.0)
Hemoglobin: 13.8 g/dL (ref 12.0–15.0)
LYMPHS PCT: 3 % — AB (ref 12–46)
Lymphs Abs: 0.5 10*3/uL — ABNORMAL LOW (ref 0.7–4.0)
MCH: 31.4 pg (ref 26.0–34.0)
MCHC: 35.1 g/dL (ref 30.0–36.0)
MCV: 89.3 fL (ref 78.0–100.0)
MONOS PCT: 6 % (ref 3–12)
Monocytes Absolute: 0.9 10*3/uL (ref 0.1–1.0)
NEUTROS PCT: 91 % — AB (ref 43–77)
Neutro Abs: 12.8 10*3/uL — ABNORMAL HIGH (ref 1.7–7.7)
PLATELETS: 179 10*3/uL (ref 150–400)
RBC: 4.4 MIL/uL (ref 3.87–5.11)
RDW: 12.7 % (ref 11.5–15.5)
WBC: 14.2 10*3/uL — ABNORMAL HIGH (ref 4.0–10.5)

## 2015-01-13 LAB — URINE MICROSCOPIC-ADD ON

## 2015-01-13 LAB — URINALYSIS, ROUTINE W REFLEX MICROSCOPIC
Bilirubin Urine: NEGATIVE
Glucose, UA: NEGATIVE mg/dL
Ketones, ur: >80 mg/dL — AB
Leukocytes, UA: NEGATIVE
Nitrite: NEGATIVE
PH: 5.5 (ref 5.0–8.0)
PROTEIN: 30 mg/dL — AB
SPECIFIC GRAVITY, URINE: 1.009 (ref 1.005–1.030)
UROBILINOGEN UA: 0.2 mg/dL (ref 0.0–1.0)

## 2015-01-13 LAB — COMPREHENSIVE METABOLIC PANEL
ALBUMIN: 3.7 g/dL (ref 3.5–5.0)
ALK PHOS: 51 U/L (ref 38–126)
ALT: 13 U/L — AB (ref 14–54)
AST: 24 U/L (ref 15–41)
Anion gap: 16 — ABNORMAL HIGH (ref 5–15)
BUN: 18 mg/dL (ref 6–20)
CO2: 18 mmol/L — ABNORMAL LOW (ref 22–32)
Calcium: 8.9 mg/dL (ref 8.9–10.3)
Chloride: 93 mmol/L — ABNORMAL LOW (ref 101–111)
Creatinine, Ser: 1.04 mg/dL — ABNORMAL HIGH (ref 0.44–1.00)
GFR calc non Af Amer: >60 mL/min (ref >60)
Glucose, Bld: 98 mg/dL (ref 65–99)
POTASSIUM: 3.6 mmol/L (ref 3.5–5.1)
Sodium: 127 mmol/L — ABNORMAL LOW (ref 135–145)
TOTAL PROTEIN: 7.2 g/dL (ref 6.5–8.1)
Total Bilirubin: 1.1 mg/dL (ref 0.3–1.2)

## 2015-01-13 LAB — POC URINE PREG, ED: Preg Test, Ur: NEGATIVE

## 2015-01-13 MED ORDER — SODIUM CHLORIDE 0.9 % IV BOLUS (SEPSIS)
1000.0000 mL | Freq: Once | INTRAVENOUS | Status: AC
  Administered 2015-01-13: 1000 mL via INTRAVENOUS

## 2015-01-13 MED ORDER — ACETAMINOPHEN 325 MG PO TABS
650.0000 mg | ORAL_TABLET | Freq: Once | ORAL | Status: AC
  Administered 2015-01-13: 650 mg via ORAL
  Filled 2015-01-13: qty 2

## 2015-01-13 MED ORDER — LEVOFLOXACIN IN D5W 750 MG/150ML IV SOLN
750.0000 mg | Freq: Once | INTRAVENOUS | Status: AC
  Administered 2015-01-13: 750 mg via INTRAVENOUS
  Filled 2015-01-13: qty 150

## 2015-01-13 MED ORDER — LEVOFLOXACIN 750 MG PO TABS: ORAL_TABLET | ORAL | Status: DC

## 2015-01-13 NOTE — ED Provider Notes (Signed)
Dehydration noted but given IV fluids She has had mild hyponatremia before Stable for d/c home   Zadie Rhineonald Glendale Youngblood, MD 01/13/15 1444

## 2015-01-13 NOTE — ED Provider Notes (Signed)
CSN: [id number]     Arrival date & time 01/13/15  1013 History   First MD Initiated Contact with Patient 01/13/15 1051     Chief Complaint  Patient presents with  . Generalized Body Aches  . Chills     Patient is a 49 y.o. female presenting with cough. The history is provided by the patient.  Cough Severity:  Moderate Onset quality:  Gradual Duration:  2 days Timing:  Intermittent Progression:  Worsening Chronicity:  New Smoker: yes   Relieved by:  None tried Worsened by:  Nothing tried Associated symptoms: chills, fever, headaches and rhinorrhea   Pt reports cough/fever/chills for past 2 days No vomiting/diarrhea She reports mild HA No rash No tick bites No travel   Past Medical History  Diagnosis Date  . Asthma   . Kidney stone    History reviewed. No pertinent past surgical history. No family history on file. History  Substance Use Topics  . Smoking status: Current Every Day Smoker  . Smokeless tobacco: Not on file  . Alcohol Use: Yes   OB History    No data available     Review of Systems  Constitutional: Positive for fever, chills and fatigue.  HENT: Positive for rhinorrhea.   Respiratory: Positive for cough.   Gastrointestinal: Negative for vomiting.  Neurological: Positive for headaches.  All other systems reviewed and are negative.     Allergies  Aspirin; Codeine; Motrin; and Penicillins  Home Medications   Prior to Admission medications   Medication Sig Start Date End Date Taking? Authorizing Provider  albuterol (PROVENTIL HFA;VENTOLIN HFA) 108 (90 BASE) MCG/ACT inhaler Inhale 2 puffs into the lungs every 6 (six) hours as needed for wheezing. 12/12/12   Linna Hoff, MD  ALPRAZolam Prudy Feeler) 1 MG tablet Take 1 mg by mouth 2 (two) times daily as needed for anxiety.     Historical Provider, MD  citalopram (CELEXA) 10 MG tablet Take 10 mg by mouth at bedtime.     Historical Provider, MD  ibuprofen (ADVIL,MOTRIN) 200 MG tablet Take 500 mg by mouth  daily as needed for moderate pain.     Historical Provider, MD   BP 117/71 mmHg  Pulse 114  Temp(Src) 99.9 F (37.7 C) (Oral)  Resp 18  SpO2 96%  LMP 04/03/2013 Physical Exam CONSTITUTIONAL: ill appearing, smells ketotic HEAD: Normocephalic/atraumatic EYES: EOMI/PERRL ENMT: Mucous membranes dry, uvula midline, no erythema/exudates NECK: supple no meningeal signs SPINE/BACK:entire spine nontender CV: S1/S2 noted, no murmurs/rubs/gallops noted LUNGS: scattered wheezing bilaterally, no apparent distress ABDOMEN: soft, nontender, no rebound or guarding, bowel sounds noted throughout abdomen GU:no cva tenderness NEURO: Pt is awake/alert/appropriate, moves all extremitiesx4.  No facial droop.   EXTREMITIES: pulses normal/equal, full ROM SKIN: warm, color normal PSYCH: no abnormalities of mood noted, alert and oriented to situation  ED Course  Procedures  2:42 PM Pt stable BP 111/73 mmHg  Pulse 75  Temp(Src) 99.9 F (37.7 C) (Oral)  Resp 16  SpO2 98%  LMP 04/03/2013 Advised need for f/u CXR in 3-4 weeks Discussed need to quit smoking She is no longer on celexa, levaquin safe for use Appropriate for d/c home No hypoxia noted  Medications  sodium chloride 0.9 % bolus 1,000 mL (0 mLs Intravenous Stopped 01/13/15 1205)  levofloxacin (LEVAQUIN) IVPB 750 mg (750 mg Intravenous New Bag/Given 01/13/15 1202)  sodium chloride 0.9 % bolus 1,000 mL (1,000 mLs Intravenous New Bag/Given 01/13/15 1221)  acetaminophen (TYLENOL) tablet 650 mg (650 mg Oral Given 01/13/15  1225)    Labs Review Labs Reviewed  CBC WITH DIFFERENTIAL/PLATELET - Abnormal; Notable for the following:    WBC 14.2 (*)    Neutrophils Relative % 91 (*)    Neutro Abs 12.8 (*)    Lymphocytes Relative 3 (*)    Lymphs Abs 0.5 (*)    All other components within normal limits  COMPREHENSIVE METABOLIC PANEL - Abnormal; Notable for the following:    Sodium 127 (*)    Chloride 93 (*)    CO2 18 (*)    Creatinine, Ser 1.04 (*)     ALT 13 (*)    Anion gap 16 (*)    All other components within normal limits  URINALYSIS, ROUTINE W REFLEX MICROSCOPIC (NOT AT [facility]) - Abnormal; Notable for the following:    APPearance CLOUDY (*)    Hgb urine dipstick LARGE (*)    Ketones, ur >80 (*)    Protein, ur 30 (*)    All other components within normal limits  URINE MICROSCOPIC-ADD ON - Abnormal; Notable for the following:    Squamous Epithelial / LPF MANY (*)    All other components within normal limits  POC URINE PREG, ED    Imaging Review Dg Chest 2 View  01/13/2015   CLINICAL DATA:  49 year old with cough, fever and body aches. History of asthma and smoking.  EXAM: CHEST  2 VIEW  COMPARISON:  Radiographs 03/30/2014.  FINDINGS: The heart size and mediastinal contours are stable without evidence of adenopathy. There is new left lower lobe airspace disease medially, retrocardiac in position on the frontal examination. This partially obscures the left hemidiaphragm posteriorly on the lateral view. The right lung is clear. No significant pleural effusion. The bones appear unchanged.  IMPRESSION: Medial left lower lobe consolidation most consistent with pneumonia. Followup PA and lateral chest X-ray is recommended in 3-4 weeks following trial of antibiotic therapy to ensure resolution and exclude underlying malignancy.   Electronically Signed   By: Carey BullocksWilliam  Veazey M.D.   On: 01/13/2015 11:06      MDM   Final diagnoses:  CAP (community acquired pneumonia)    Nursing notes including past medical history and social history reviewed and considered in documentation Labs/vital reviewed myself and considered during evaluation xrays/imaging reviewed by myself and considered during evaluation     Zadie Rhineonald Shea Kapur, MD 01/13/15 1442

## 2015-01-13 NOTE — ED Notes (Addendum)
Pt reports body aches and fever since Monday. Pt states that she had a fever of 103 at home yesterday. Pt reports some nausea. Pt states that she has decreased appetite as well. Pt has had ear aches and has been lightheaded.

## 2015-05-20 ENCOUNTER — Emergency Department (INDEPENDENT_AMBULATORY_CARE_PROVIDER_SITE_OTHER)
Admission: EM | Admit: 2015-05-20 | Discharge: 2015-05-20 | Disposition: A | Discharge status: Home / Self Care | Payer: Medicaid Other | Source: Home / Self Care | Attending: Family Medicine | Admitting: Family Medicine

## 2015-05-20 ENCOUNTER — Encounter (HOSPITAL_COMMUNITY): Payer: Self-pay | Admitting: Emergency Medicine

## 2015-05-20 DIAGNOSIS — Z720 Tobacco use: Secondary | ICD-10-CM

## 2015-05-20 DIAGNOSIS — J069 Acute upper respiratory infection, unspecified: Secondary | ICD-10-CM

## 2015-05-20 NOTE — ED Provider Notes (Signed)
CSN: [id number]     Arrival date & time 05/20/15  1434 History   First MD Initiated Contact with Patient 05/20/15 1612     No chief complaint on file.  (Consider location/radiation/quality/duration/timing/severity/associated sxs/prior Treatment) HPI Comments: 49-year-old female states that she hurts from her chest up to her head. She is complaining of headache, runny nose with PND, cough, laryngitis. She has a history of asthma and she currently smokes. Denies shortness of breath. She has a dry cough.   Past Medical History  Diagnosis Date  . Asthma   . Kidney stone    History reviewed. No pertinent past surgical history. No family history on file. Social History  Substance Use Topics  . Smoking status: Current Every Day Smoker  . Smokeless tobacco: None  . Alcohol Use: Yes   OB History    No data available     Review of Systems  Constitutional: Positive for fever and activity change. Negative for chills.  HENT: Positive for congestion, ear pain, postnasal drip, rhinorrhea and sore throat.   Eyes: Negative.   Respiratory: Positive for cough. Negative for chest tightness and shortness of breath.   Cardiovascular: Negative for chest pain and leg swelling.  Gastrointestinal: Negative.   Genitourinary: Negative.   Skin: Negative for rash.  Neurological: Negative.     Allergies  Aspirin; Codeine; Motrin; and Penicillins  Home Medications   Prior to Admission medications   Medication Sig Start Date End Date Taking? Authorizing Provider  albuterol (PROVENTIL HFA;VENTOLIN HFA) 108 (90 BASE) MCG/ACT inhaler Inhale 2 puffs into the lungs every 6 (six) hours as needed for wheezing. 12/12/12   Linna Hoff, MD  ALPRAZolam Prudy Feeler) 1 MG tablet Take 1 mg by mouth 2 (two) times daily as needed for anxiety.     Historical Provider, MD  ibuprofen (ADVIL,MOTRIN) 200 MG tablet Take 400 mg by mouth daily as needed for moderate pain.     Historical Provider, MD  levofloxacin (LEVAQUIN) 750 MG  tablet One PO daily for 6 days 01/13/15   Zadie Rhine, MD   Meds Ordered and Administered this Visit  Medications - No data to display  BP 129/84 mmHg  Pulse 84  Temp(Src) 98.4 F (36.9 C) (Oral)  Resp 14  SpO2 96%  LMP 05/04/2013 No data found.   Physical Exam  Constitutional: She is oriented to person, place, and time. She appears well-developed and well-nourished. No distress.  HENT:  Mouth/Throat: No oropharyngeal exudate.  Follow-up bilateral TMs are mildly retracted. No erythema or effusion Oropharynx with minor erythema and clear PND.  Eyes: Conjunctivae and EOM are normal.  Neck: Normal range of motion. Neck supple.  Cardiovascular: Normal rate, regular rhythm and normal heart sounds.   Pulmonary/Chest: Effort normal and breath sounds normal. No respiratory distress. She exhibits tenderness.  Few, distant and expiratory wheezes. No crackles. Air movement fair to good  Abdominal: Soft. There is no tenderness.  Musculoskeletal: Normal range of motion. She exhibits no edema.  Lymphadenopathy:    She has no cervical adenopathy.  Neurological: She is alert and oriented to person, place, and time. She exhibits normal muscle tone.  Skin: Skin is warm and dry. No rash noted.  Psychiatric: She has a normal mood and affect.  Nursing note and vitals reviewed.   ED Course  Procedures (including critical care time)  Labs Review Labs Reviewed - No data to display  Imaging Review No results found.   Visual Acuity Review  Right Eye Distance:  Left Eye Distance:   Bilateral Distance:    Right Eye Near:   Left Eye Near:    Bilateral Near:         MDM   1. URI (upper respiratory infection)   2. Tobacco abuse disorder    For drainage which is causing many of her symptoms may take Allegra or Claritin or Zyrtec daily. For stronger any histamines it may cause drowsiness may take Chlor-Trimeton and nighttime. For congestion A take Sudafed PE 10 mg every 4 hours as  needed Drink plenty of fluids stay well-hydrated Stop smoking     Hayden Rasmussen, NP 05/20/15 1701

## 2015-05-20 NOTE — Discharge Instructions (Signed)
Viral Infections For drainage which is causing many of her symptoms may take Allegra or Claritin or Zyrtec daily. For stronger any histamines it may cause drowsiness may take Chlor-Trimeton and nighttime. For congestion A take Sudafed PE 10 mg every 4 hours as needed Drink plenty of fluids stay well-hydrated Stop smoking A viral infection can be caused by different types of viruses.Most viral infections are not serious and resolve on their own. However, some infections may cause severe symptoms and may lead to further complications. SYMPTOMS Viruses can frequently cause:  Minor sore throat.  Aches and pains.  Headaches.  Runny nose.  Different types of rashes.  Watery eyes.  Tiredness.  Cough.  Loss of appetite.  Gastrointestinal infections, resulting in nausea, vomiting, and diarrhea. These symptoms do not respond to antibiotics because the infection is not caused by bacteria. However, you might catch a bacterial infection following the viral infection. This is sometimes called a "superinfection." Symptoms of such a bacterial infection may include:  Worsening sore throat with pus and difficulty swallowing.  Swollen neck glands.  Chills and a high or persistent fever.  Severe headache.  Tenderness over the sinuses.  Persistent overall ill feeling (malaise), muscle aches, and tiredness (fatigue).  Persistent cough.  Yellow, green, or brown mucus production with coughing. HOME CARE INSTRUCTIONS   Only take over-the-counter or prescription medicines for pain, discomfort, diarrhea, or fever as directed by your caregiver.  Drink enough water and fluids to keep your urine clear or pale yellow. Sports drinks can provide valuable electrolytes, sugars, and hydration.  Get plenty of rest and maintain proper nutrition. Soups and broths with crackers or rice are fine. SEEK IMMEDIATE MEDICAL CARE IF:   You have severe headaches, shortness of breath, chest pain, neck pain, or  an unusual rash.  You have uncontrolled vomiting, diarrhea, or you are unable to keep down fluids.  You or your child has an oral temperature above 102 F (38.9 C), not controlled by medicine.  Your baby is older than 3 months with a rectal temperature of 102 F (38.9 C) or higher.  Your baby is 54 months old or younger with a rectal temperature of 100.4 F (38 C) or higher. MAKE SURE YOU:   Understand these instructions.  Will watch your condition.  Will get help right away if you are not doing well or get worse.   This information is not intended to replace advice given to you by your health care provider. Make sure you discuss any questions you have with your health care provider.   Document Released: 05/10/2005 Document Revised: 10/23/2011 Document Reviewed: 01/06/2015 Elsevier Interactive Patient Education 2016 [employer].  Smoking Hazards Smoking cigarettes is extremely bad for your health. Tobacco smoke has over 200 known poisons in it. It contains the poisonous gases nitrogen oxide and carbon monoxide. There are over 60 chemicals in tobacco smoke that cause cancer. Some of the chemicals found in cigarette smoke include:   Cyanide.   Benzene.   Formaldehyde.   Methanol (wood alcohol).   Acetylene (fuel used in welding torches).   Ammonia.  Even smoking lightly shortens your life expectancy by several years. You can greatly reduce the risk of medical problems for you and your family by stopping now. Smoking is the most preventable cause of death and disease in our society. Within days of quitting smoking, your circulation improves, you decrease the risk of having a heart attack, and your lung capacity improves. There may be some increased phlegm  in the first few days after quitting, and it may take months for your lungs to clear up completely. Quitting for 10 years reduces your risk of developing lung cancer to almost that of a nonsmoker.  WHAT ARE THE RISKS OF  SMOKING? Cigarette smokers have an increased risk of many serious medical problems, including:  Lung cancer.   Lung disease (such as pneumonia, bronchitis, and emphysema).   Heart attack and chest pain due to the heart not getting enough oxygen (angina).   Heart disease and peripheral blood vessel disease.   Hypertension.   Stroke.   Oral cancer (cancer of the lip, mouth, or voice box).   Bladder cancer.   Pancreatic cancer.   Cervical cancer.   Pregnancy complications, including premature birth.   Stillbirths and smaller newborn babies, birth defects, and genetic damage to sperm.   Early menopause.   Lower estrogen level for women.   Infertility.   Facial wrinkles.   Blindness.   Increased risk of broken bones (fractures).   Senile dementia.   Stomach ulcers and internal bleeding.   Delayed wound healing and increased risk of complications during surgery. Because of secondhand smoke exposure, children of smokers have an increased risk of the following:   Sudden infant death syndrome (SIDS).   Respiratory infections.   Lung cancer.   Heart disease.   Ear infections.  WHY IS SMOKING ADDICTIVE? Nicotine is the chemical agent in tobacco that is capable of causing addiction or dependence. When you smoke and inhale, nicotine is absorbed rapidly into the bloodstream through your lungs. Both inhaled and noninhaled nicotine may be addictive.  WHAT ARE THE BENEFITS OF QUITTING?  There are many health benefits to quitting smoking. Some are:   The likelihood of developing cancer and heart disease decreases. Health improvements are seen almost immediately.   Blood pressure, pulse rate, and breathing patterns start returning to normal soon after quitting.   People who quit may see an improvement in their overall quality of life.  HOW DO YOU QUIT SMOKING? Smoking is an addiction with both physical and psychological effects, and longtime  habits can be hard to change. Your health care provider can recommend:  Programs and community resources, which may include group support, education, or therapy.  Replacement products, such as patches, gum, and nasal sprays. Use these products only as directed. Do not replace cigarette smoking with electronic cigarettes (commonly called e-cigarettes). The safety of e-cigarettes is unknown, and some may contain harmful chemicals. FOR MORE INFORMATION  American Lung Association: www.lung.org  American Cancer Society: www.cancer.org   This information is not intended to replace advice given to you by your health care provider. Make sure you discuss any questions you have with your health care provider.   Document Released: 09/07/2004 Document Revised: 05/21/2013 Document Reviewed: 01/20/2013 Elsevier Interactive Patient Education [employer]

## 2015-07-05 ENCOUNTER — Emergency Department (HOSPITAL_COMMUNITY): Payer: Medicaid Other

## 2015-07-05 ENCOUNTER — Emergency Department (HOSPITAL_COMMUNITY)
Admission: EM | Admit: 2015-07-05 | Discharge: 2015-07-05 | Disposition: A | Discharge status: Home / Self Care | Payer: Self-pay | Attending: Emergency Medicine | Admitting: Emergency Medicine

## 2015-07-05 ENCOUNTER — Encounter (HOSPITAL_COMMUNITY): Payer: Self-pay | Admitting: *Deleted

## 2015-07-05 DIAGNOSIS — J45909 Unspecified asthma, uncomplicated: Secondary | ICD-10-CM | POA: Insufficient documentation

## 2015-07-05 DIAGNOSIS — W19XXXA Unspecified fall, initial encounter: Secondary | ICD-10-CM

## 2015-07-05 DIAGNOSIS — W010XXA Fall on same level from slipping, tripping and stumbling without subsequent striking against object, initial encounter: Secondary | ICD-10-CM | POA: Insufficient documentation

## 2015-07-05 DIAGNOSIS — Y92009 Unspecified place in unspecified non-institutional (private) residence as the place of occurrence of the external cause: Secondary | ICD-10-CM | POA: Insufficient documentation

## 2015-07-05 DIAGNOSIS — S20212A Contusion of left front wall of thorax, initial encounter: Secondary | ICD-10-CM | POA: Insufficient documentation

## 2015-07-05 DIAGNOSIS — Z87442 Personal history of urinary calculi: Secondary | ICD-10-CM | POA: Insufficient documentation

## 2015-07-05 DIAGNOSIS — S6991XA Unspecified injury of right wrist, hand and finger(s), initial encounter: Secondary | ICD-10-CM | POA: Insufficient documentation

## 2015-07-05 DIAGNOSIS — Z79899 Other long term (current) drug therapy: Secondary | ICD-10-CM | POA: Insufficient documentation

## 2015-07-05 DIAGNOSIS — F17200 Nicotine dependence, unspecified, uncomplicated: Secondary | ICD-10-CM | POA: Insufficient documentation

## 2015-07-05 DIAGNOSIS — S4991XA Unspecified injury of right shoulder and upper arm, initial encounter: Secondary | ICD-10-CM | POA: Insufficient documentation

## 2015-07-05 DIAGNOSIS — Z880 Allergy status to penicillin: Secondary | ICD-10-CM | POA: Insufficient documentation

## 2015-07-05 DIAGNOSIS — Y9301 Activity, walking, marching and hiking: Secondary | ICD-10-CM | POA: Insufficient documentation

## 2015-07-05 DIAGNOSIS — Y998 Other external cause status: Secondary | ICD-10-CM | POA: Insufficient documentation

## 2015-07-05 MED ORDER — HYDROCODONE-ACETAMINOPHEN 5-325 MG PO TABS: ORAL_TABLET | ORAL | Status: DC

## 2015-07-05 MED ORDER — HYDROCODONE-ACETAMINOPHEN 5-325 MG PO TABS
1.0000 | ORAL_TABLET | Freq: Once | ORAL | Status: AC
  Administered 2015-07-05: 1 via ORAL
  Filled 2015-07-05: qty 1

## 2015-07-05 NOTE — ED Notes (Signed)
Pt reports tripping over the dog last night and fell, reports landing on right arm and hit chin on floor, denies loc. Pt has pain to left ribs and right shoulder.

## 2015-07-05 NOTE — ED Provider Notes (Signed)
CSN: [id number]     Arrival date & time 07/05/15  1033 History  By signing my name below, I, Lyndel SafeKaitlyn Shelton, attest that this documentation has been prepared under the direction and in the presence of [employer] Polina Burmaster, PA-C. Electronically Signed: Lyndel SafeKaitlyn Shelton, ED Scribe. 07/05/2015. 11:15 AM.   Chief Complaint  Patient presents with  . Fall    The history is provided by the patient. No language interpreter was used.   HPI Comments: Jeanne Huynh is a 49 y.o. female, with no pertinent PMhx, who presents to the Emergency Department complaining of sudden onset, constant, moderate pain to right arm and hand and left ribs s/p fall on out stretched right arm that occurred last night. Pt reports she was walking in the dark and tripped over her dog falling onto her right arm and chin onto carpeted floor. She associates right hand pain, right shoulder pain and left anterior rib pain. Pt has been taking ibuprofen at home without significant relief. She has a PMhx of right wrist fracture that did not require surgery. Denies LOC. No overlying skin changes.   Past Medical History  Diagnosis Date  . Asthma   . Kidney stone    History reviewed. No pertinent past surgical history. History reviewed. No pertinent family history. Social History  Substance Use Topics  . Smoking status: Current Every Day Smoker  . Smokeless tobacco: None  . Alcohol Use: Yes   OB History    No data available     Review of Systems A complete 10 system review of systems was obtained and is otherwise negative except at noted in the HPI and PMH.  Allergies  Aspirin; Codeine; Motrin; and Penicillins  Home Medications   Prior to Admission medications   Medication Sig Start Date End Date Taking? Authorizing Provider  albuterol (PROVENTIL HFA;VENTOLIN HFA) 108 (90 BASE) MCG/ACT inhaler Inhale 2 puffs into the lungs every 6 (six) hours as needed for wheezing. 12/12/12   Linna HoffJames D Kindl, MD  ALPRAZolam Prudy Feeler(XANAX) 1 MG  tablet Take 1 mg by mouth 2 (two) times daily as needed for anxiety.     Historical Provider, MD  citalopram (CELEXA) 10 MG tablet Take 10 mg by mouth daily.    Historical Provider, MD  ibuprofen (ADVIL,MOTRIN) 200 MG tablet Take 400 mg by mouth daily as needed for moderate pain.     Historical Provider, MD  levofloxacin (LEVAQUIN) 750 MG tablet One PO daily for 6 days Patient not taking: Reported on 05/20/2015 01/13/15   Zadie Rhineonald Wickline, MD   BP 129/83 mmHg  Pulse 104  Temp(Src) 97.6 F (36.4 C) (Oral)  Resp 20  SpO2 98%  LMP 05/04/2013 Physical Exam  Constitutional: She is oriented to person, place, and time. She appears well-developed and well-nourished. No distress.  HENT:  Head: Normocephalic and atraumatic.  Mouth/Throat: Oropharynx is clear and moist.  Eyes: Conjunctivae and EOM are normal. Pupils are equal, round, and reactive to light.  No TTP of maxillary or frontal sinuses  No TTP or induration of temporal arteries bilaterally  Neck: Normal range of motion. Neck supple.  No midline C-spine  tenderness to palpation or step-offs appreciated. Patient has full range of motion without pain.  Grip strength, biceps, triceps 5/5 bilaterally;  can differentiate between pinprick and light touch bilaterally.   Cardiovascular: Normal rate, regular rhythm and intact distal pulses.   Pulmonary/Chest: Effort normal and breath sounds normal. No respiratory distress. She has no wheezes. She has no rales. She exhibits  tenderness.  Tender to palpation along the left anterior lower ribs with no crepitus, no bruising or erythema. Lung sounds clear to auscultation bilaterally.  Abdominal: Soft. Bowel sounds are normal. She exhibits no distension and no mass. There is no tenderness. There is no rebound and no guarding.  Musculoskeletal: Normal range of motion. She exhibits tenderness. She exhibits no edema.  Diffusely tender to palpation along the right arm, she can abductor shoulder greater than  90. Drop arm is negative. There is no focal bony tenderness palpation, no snuffbox tenderness. Excellent range of motion to fingers and wrist and she is distally neurovascularly intact and can differentiate between pinprick and light touch, radial pulses 2+.  Neurological: She is alert and oriented to person, place, and time. No cranial nerve deficit. Coordination normal.  II-Visual fields grossly intact. III/IV/VI-Extraocular movements intact.  Pupils reactive bilaterally. V/VII-Smile symmetric, equal eyebrow raise,  facial sensation intact VIII- Hearing grossly intact IX/X-Normal gag XI-bilateral shoulder shrug XII-midline tongue extension Motor: 5/5 bilaterally with normal tone and bulk Cerebellar: Normal finger-to-nose  and normal heel-to-shin test.   Romberg negative Ambulates with a coordinated gait   Skin: Skin is warm.  Psychiatric: She has a normal mood and affect. Her behavior is normal.  Nursing note and vitals reviewed.  ED Course  Procedures  DIAGNOSTIC STUDIES: Oxygen Saturation is 98% on RA, normal by my interpretation.    COORDINATION OF CARE: 11:14 AM Discussed treatment plan with pt at bedside which includes to order imaging of right hand, right shoulder and left ribs. Pain medication ordered. Pt agreed to plan.  Imaging Review Dg Ribs Unilateral W/chest Left  07/05/2015  CLINICAL DATA:  Fall, right wrist pain, left rib pain EXAM: LEFT RIBS AND CHEST - 3+ VIEW COMPARISON:  01/13/2015 FINDINGS: Three views left ribs submitted. No rib fracture is identified. No pneumothorax. No acute infiltrate or pulmonary edema. IMPRESSION: Negative. Electronically Signed   By: Natasha Mead M.D.   On: 07/05/2015 12:15   Dg Shoulder Right  07/05/2015  CLINICAL DATA:  Larey Seat walking dog. abrasions to right wrist. Right shoulder pain. EXAM: RIGHT SHOULDER - 2+ VIEW COMPARISON:  None. FINDINGS: No acute bony abnormality. Specifically, no fracture, subluxation, or dislocation. Soft tissues  are intact. IMPRESSION: No acute bony abnormality. Electronically Signed   By: Charlett Nose M.D.   On: 07/05/2015 12:12   Dg Hand Complete Right  07/05/2015  CLINICAL DATA:  49 year old female with history of trauma from a fall while walking her dog today. Right hand pain. EXAM: RIGHT HAND - COMPLETE 3+ VIEW COMPARISON:  No priors. FINDINGS: Multiple views of the right hand demonstrate no acute displaced fracture, subluxation, dislocation, or soft tissue abnormality. Well corticated fragment adjacent to the ulnar styloid, presumably an old ulnar styloid avulsion fracture. IMPRESSION: No acute radiographic abnormality of the right hand. Electronically Signed   By: Trudie Reed M.D.   On: 07/05/2015 12:15   I have personally reviewed and evaluated these images as part of my medical decision-making.   MDM   Final diagnoses:  None    Filed Vitals:   07/05/15 1100 07/05/15 1227  BP: 129/83 129/79  Pulse: 104 73  Temp: 97.6 F (36.4 C)   TempSrc: Oral   Resp: 20 18  SpO2: 98% 95%    Medications  HYDROcodone-acetaminophen (NORCO/VICODIN) 5-325 MG per tablet 1 tablet (1 tablet Oral Given 07/05/15 1210)    TEKESHA ALMGREN is 49 y.o. female presenting with she's pain in the right arm  and left ribs status post mechanical fall last night. Patient is saturating well on room air, mild tachycardia initially which has resolved. Rib series with no fracture. Patient is given incentive spirometer and Vicodin for pain control. Imaging of the arm is also negative. No neuro imaging is indicated based on the [place] CT rules.  Evaluation does not show pathology that would require ongoing emergent intervention or inpatient treatment. Pt is hemodynamically stable and mentating appropriately. Discussed findings and plan with patient/guardian, who agrees with care plan. All questions answered. Return precautions discussed and outpatient follow up given.   Discharge Medication List as of 07/05/2015 12:22  PM    START taking these medications   Details  HYDROcodone-acetaminophen (NORCO/VICODIN) 5-325 MG tablet Take 1-2 tablets by mouth every 6 hours as needed for pain and/or cough., Print       I personally performed the services described in this documentation, which was scribed in my presence. The recorded information has been reviewed and is accurate.    Wynetta Emery, PA-C 07/05/15 1800  Melene Plan, DO 07/05/15 2020

## 2015-07-05 NOTE — Discharge Instructions (Signed)
Take percocet for breakthrough pain, do not drink alcohol, drive, care for children or do other critical tasks while taking percocet.   It is very important that you take deep breaths to prevent lung collapse and infection.  Either use your incentive spirometer or take 10 deep breaths every hour to prevent lung collapse.  If you develop cough, fever or shortness of breath return immediately to the emergency room.    Do not hesitate to return to the emergency room for any new, worsening or concerning symptoms.  Please obtain primary care using resource guide below. Let them know that you were seen in the emergency room and that they will need to obtain records for further outpatient management.    Emergency Department Resource Guide 1) Find a Doctor and Pay Out of Pocket Although you won't have to find out who is covered by your insurance plan, it is a good idea to ask around and get recommendations. You will then need to call the office and see if the doctor you have chosen will accept you as a new patient and what types of options they offer for patients who are self-pay. Some doctors offer discounts or will set up payment plans for their patients who do not have insurance, but you will need to ask so you aren't surprised when you get to your appointment.  2) Contact Your Local Health Department Not all health departments have doctors that can see patients for sick visits, but many do, so it is worth a call to see if yours does. If you don't know where your local health department is, you can check in your phone book. The CDC also has a tool to help you locate your state's health department, and many state websites also have listings of all of their local health departments.  3) Find a Walk-in Clinic If your illness is not likely to be very severe or complicated, you may want to try a walk in clinic. These are popping up all over the country in pharmacies, drugstores, and shopping centers.  They're usually staffed by nurse practitioners or physician assistants that have been trained to treat common illnesses and complaints. They're usually fairly quick and inexpensive. However, if you have serious medical issues or chronic medical problems, these are probably not your best option.  No Primary Care Doctor: - Call Health Connect at  [phone] - they can help you locate a primary care doctor that  accepts your insurance, provides certain services, etc. - Physician Referral Service- [phone]  Chronic Pain Problems: Organization         Address  Phone   Notes  [facility]  [phone] Patients need to be referred by their primary care doctor.   Medication Assistance: Organization         Address  Phone   Notes  [facility] [place] [facility] [address] [place], [place] [id number] [phone] --Must be a resident of [facility] -- Must have NO insurance coverage whatsoever (no Medicaid/ Medicare, etc.) -- The pt. MUST have a primary care doctor that directs their care regularly and follows them in the community   [facility]  [phone]   [employer]  [phone]    Agencies that provide inexpensive medical care: Organization         Address  Phone   Notes  [facility]  [phone]   [facility]    [phone]  [facility] [place], [place] [address] [phone]   [place]. [address], [place] [phone]   Planned Parenthood    [phone]   [facility]    [phone]   [address], [place] Phone:  [phone], Fax:  [phone] Hours of Operation:  9 am - 6 pm, M-F.  Also accepts Medicaid/Medicare and self-pay.  [facility] [place], [address], [place] Phone: [phone], Fax: [phone]. Hours of Operation:  8:30 am - 5:30 pm, M-F.  Also accepts Medicaid and self-pay.  [facility] [place] [address] [place] Phone: [phone]   [place], [place], [place] [phone], Ext. [phone] Mondays & Thursdays: 7-9 AM.  First 15 patients are seen on a first come, first serve basis.    [place] Providers:  Organization         Address  Phone   Notes  [facility] [address], [place] [phone] Also accepts self-pay patients.  [facility] [address], [employer]  [phone]   [place], [address], [place] [phone]   [facility] [address] [place] [phone]   Lucianne Lei [address], [place]   [phone] Only accepts [place] Access [place] patients after they have their name applied to their card.   Self-Pay (no insurance) in [facility]:  Organization         Address  Phone   Notes  Sickle Cell Patients, [facility] [place] [phone]   [facility] [place] [phone]   [facility] [place]  [place], [place], [place] [phone]   [facility]/Dr. Osei-Bonsu  [address] [place] or [address], [place] [phone] Phone number for both [place] and [place] locations is the same.  Urgent Medical and [facility] [address] [place] [phone]   [facility] [address] [place] or [address] [phone]   [facility] [address] [place] [phone], phone; [phone], fax Sees patients 1st and 3rd Saturday of every month.  Must not qualify for public or private insurance (i.e.  Medicaid, Medicare, [place] Health Choice, Veterans' Benefits)  Household income should be no more than 200% of the poverty level The clinic cannot treat you if you are pregnant or think you are pregnant  Sexually transmitted diseases are not treated at the clinic.    Dental Care: Organization         Address  Phone  Notes  [facility] Department of [facility] [place] [phone] Accepts children up to age 21 who are enrolled in [place] or [place]; pregnant women with a Medicaid card; and children who have applied for Medicaid or [place] Health Choice, but were declined, whose parents can pay a reduced fee at time of service.  [facility] Department of [facility]  [address], [place] [phone] Accepts children up to age 70 who  are enrolled in Medicaid or [place] Health Choice; pregnant women with a Medicaid card; and children who have applied for Medicaid or [place] Health Choice, but were declined, whose parents can pay a reduced fee at time of service.  [place] Adult Dental Access PROGRAM  [place] [phone] Patients are seen by appointment only. Walk-ins are not accepted. [place] will see patients 59 years of age and older. Monday - Tuesday (8am-5pm) Most Wednesdays (8:30-5pm) $30 per visit, cash only  [facility] Adult Dental Access PROGRAM  [address], [facility] [phone] Patients are seen by appointment only. Walk-ins are not accepted. [place] will see patients 57 years of age and older. One Wednesday Evening (Monthly: Volunteer Based).  $30 per visit, cash only  [place]  [phone] for adults; Children under age 44, call Graduate Pediatric Dentistry at [phone]. Children aged 28-14, please call [phone] to request a pediatric application.  Dental services are provided in all areas of dental care including fillings,  crowns and bridges, complete and partial dentures, implants, gum treatment, root canals, and extractions. Preventive care is also provided. Treatment is provided to both adults and children. Patients are selected via a lottery and there is often a waiting list.   [facility] [address] [place]  [phone] [email or web address]   [facility] [address] [place], [place] [phone], Ext. [phone] Second and Fourth Thursday of each month, opens at 6:30 AM; Clinic ends at 9 AM.  Patients are seen on a first-come first-served basis, and a limited number are seen during each clinic.   [facility]  [address] Hillard Danker [place], [place] [phone]   Eligibility Requirements You must have lived in [place], [place], or [place] counties for at least the last three months.   You cannot be eligible for state or federal sponsored [employer], including [employer], [place], or [employer].   You generally cannot be eligible for healthcare insurance through your employer.    How to apply: Eligibility screenings are held every Tuesday and Wednesday afternoon from 1:00 pm until 4:00 pm. You do not need an appointment for the interview!  [facility] [address] [place], [place]   [place]  [place] Department  [place]  [phone]    Behavioral Health Resources in the Community: Intensive Outpatient Programs Organization         Address  Phone  Notes  [place]. [address] [place], [place] [phone]   [facility] Outpatient [address] [place], [place]   ADS: Alcohol & Drug Svcs [address] [place], [place]   [place] [address],  [employer], [place] or [phone]   Substance Abuse  Resources Organization         Address  Phone  Notes  Alcohol and Drug Services  [phone]   [place]  [phone]   The [place]   Chinita Pester  [phone]   Residential & Outpatient Substance Abuse Program  [phone]   Psychological Services Organization         Address  Phone  Notes  [facility] [place]  [place]  [phone]   [place] [address],  [place] [phone] or [phone]    Mobile Crisis Teams Organization         Address  Phone  Notes  [facility], Mobile Crisis Care Unit  [phone]   [facility]  [address] [place], [place]   [place] [address] [place] [phone]    Self-Help/Support Groups Organization         Address  Phone             Notes  [place]. of [place] - variety of support groups  [place] Call for more information  Narcotics Anonymous (NA), [facility] [address], [employer] [place]  2 meetings at this location   Special educational needs teacher         Address  Phone  Notes  [facility] [place],    [place]  [phone]   [facility]  [address] [place] [phone], [place], [place]   [place], [place] [phone] Admissions: 8am-3pm M-F  Incentives Substance [place] [address],    [place], [place] [phone]   [facility] [address] [place], [place], [place]   [facility] [address],  [place], [place]   [facility] Programs - Intensive Outpatient [address], [place], [place]   [facility] ([place].,  [place], [place] [phone] or [phone]   [facility] Services (RTS) [address], [place], [place] Accepts Medicaid  [facility] [place] [address],  [place] [phone] Substance Abuse/Addiction Treatment   [facility] Organization         Address  Phone  Notes  [facility]  [phone]   Domenic Schwab, PhD [address] Arlis Porta [place], [place]   [phone] or [phone]   [place], [place] [phone]   [facility] [address], [place], [place] [phone] Insurance/Medicaid/sponsorship through [facility] and Families [address] [place]                                    [place], [place] [phone] [place] [address], [place] [phone]    Dr. Adele Schilder  [phone]   [facility] [place] Dept. 1) [address] [place] 2) [place] 3)  [place] [address], [place] [phone]  [phone]   [place] [phone] or [phone] (After Hours)

## 2015-07-05 NOTE — ED Notes (Signed)
Respiratory at bedside to go over incentive spirometry.

## 2015-12-06 ENCOUNTER — Encounter (HOSPITAL_COMMUNITY): Payer: Self-pay | Admitting: Emergency Medicine

## 2015-12-06 ENCOUNTER — Ambulatory Visit (HOSPITAL_COMMUNITY)
Admission: EM | Admit: 2015-12-06 | Discharge: 2015-12-06 | Disposition: A | Discharge status: Home / Self Care | Payer: Medicaid Other | Attending: Family Medicine | Admitting: Family Medicine

## 2015-12-06 DIAGNOSIS — R519 Headache, unspecified: Secondary | ICD-10-CM

## 2015-12-06 DIAGNOSIS — J0100 Acute maxillary sinusitis, unspecified: Secondary | ICD-10-CM

## 2015-12-06 DIAGNOSIS — R112 Nausea with vomiting, unspecified: Secondary | ICD-10-CM

## 2015-12-06 DIAGNOSIS — R51 Headache: Secondary | ICD-10-CM

## 2015-12-06 MED ORDER — KETOROLAC TROMETHAMINE 30 MG/ML IJ SOLN
INTRAMUSCULAR | Status: AC
  Filled 2015-12-06: qty 1

## 2015-12-06 MED ORDER — ONDANSETRON 4 MG PO TBDP
ORAL_TABLET | ORAL | Status: AC
  Filled 2015-12-06: qty 1

## 2015-12-06 MED ORDER — ONDANSETRON 4 MG PO TBDP
4.0000 mg | ORAL_TABLET | Freq: Once | ORAL | Status: AC
  Administered 2015-12-06: 4 mg via ORAL

## 2015-12-06 MED ORDER — DEXAMETHASONE SODIUM PHOSPHATE 10 MG/ML IJ SOLN
INTRAMUSCULAR | Status: AC
  Filled 2015-12-06: qty 1

## 2015-12-06 MED ORDER — TRAMADOL HCL 50 MG PO TABS: 50.0000 mg | ORAL_TABLET | Freq: Four times a day (QID) | ORAL | Status: DC | PRN

## 2015-12-06 MED ORDER — KETOROLAC TROMETHAMINE 30 MG/ML IJ SOLN
30.0000 mg | Freq: Once | INTRAMUSCULAR | Status: AC
  Administered 2015-12-06: 30 mg via INTRAMUSCULAR

## 2015-12-06 MED ORDER — LEVOFLOXACIN 500 MG PO TABS: 500.0000 mg | ORAL_TABLET | Freq: Every day | ORAL | Status: DC

## 2015-12-06 MED ORDER — DEXAMETHASONE SODIUM PHOSPHATE 10 MG/ML IJ SOLN
5.0000 mg | Freq: Once | INTRAMUSCULAR | Status: AC
  Administered 2015-12-06: 5 mg via INTRAMUSCULAR

## 2015-12-06 MED ORDER — ONDANSETRON HCL 4 MG PO TABS: 4.0000 mg | ORAL_TABLET | Freq: Four times a day (QID) | ORAL | Status: DC

## 2015-12-06 NOTE — Discharge Instructions (Signed)
For any worsening, new symptoms or problems such as worsening headache, persistent vomiting after taking medication, change in vision, slurring of speech, problems hearing or swallowing or weakness on one side of the body, numbness, unusual sleepiness, confusion, problems with memory or any other abnormal changes or new symptoms come directly to the emergency department promptly.   Nausea and Vomiting Nausea is a sick feeling that often comes before throwing up (vomiting). Vomiting is a reflex where stomach contents come out of your mouth. Vomiting can cause severe loss of body fluids (dehydration). Children and elderly adults can become dehydrated quickly, especially if they also have diarrhea. Nausea and vomiting are symptoms of a condition or disease. It is important to find the cause of your symptoms. CAUSES   Direct irritation of the stomach lining. This irritation can result from increased acid production (gastroesophageal reflux disease), infection, food poisoning, taking certain medicines (such as nonsteroidal anti-inflammatory drugs), alcohol use, or tobacco use.  Signals from the brain.These signals could be caused by a headache, heat exposure, an inner ear disturbance, increased pressure in the brain from injury, infection, a tumor, or a concussion, pain, emotional stimulus, or metabolic problems.  An obstruction in the gastrointestinal tract (bowel obstruction).  Illnesses such as diabetes, hepatitis, gallbladder problems, appendicitis, kidney problems, cancer, sepsis, atypical symptoms of a heart attack, or eating disorders.  Medical treatments such as chemotherapy and radiation.  Receiving medicine that makes you sleep (general anesthetic) during surgery. DIAGNOSIS Your caregiver may ask for tests to be done if the problems do not improve after a few days. Tests may also be done if symptoms are severe or if the reason for the nausea and vomiting is not clear. Tests may  include:  Urine tests.  Blood tests.  Stool tests.  Cultures (to look for evidence of infection).  X-rays or other imaging studies. Test results can help your caregiver make decisions about treatment or the need for additional tests. TREATMENT You need to stay well hydrated. Drink frequently but in small amounts.You may wish to drink water, sports drinks, clear broth, or eat frozen ice pops or gelatin dessert to help stay hydrated.When you eat, eating slowly may help prevent nausea.There are also some antinausea medicines that may help prevent nausea. HOME CARE INSTRUCTIONS   Take all medicine as directed by your caregiver.  If you do not have an appetite, do not force yourself to eat. However, you must continue to drink fluids.  If you have an appetite, eat a normal diet unless your caregiver tells you differently.  Eat a variety of complex carbohydrates (rice, wheat, potatoes, bread), lean meats, yogurt, fruits, and vegetables.  Avoid high-fat foods because they are more difficult to digest.  Drink enough water and fluids to keep your urine clear or pale yellow.  If you are dehydrated, ask your caregiver for specific rehydration instructions. Signs of dehydration may include:  Severe thirst.  Dry lips and mouth.  Dizziness.  Dark urine.  Decreasing urine frequency and amount.  Confusion.  Rapid breathing or pulse. SEEK IMMEDIATE MEDICAL CARE IF:   You have blood or brown flecks (like coffee grounds) in your vomit.  You have black or bloody stools.  You have a severe headache or stiff neck.  You are confused.  You have severe abdominal pain.  You have chest pain or trouble breathing.  You do not urinate at least once every 8 hours.  You develop cold or clammy skin.  You continue to vomit for longer  than 24 to 48 hours.  You have a fever. MAKE SURE YOU:   Understand these instructions.  Will watch your condition.  Will get help right away if  you are not doing well or get worse.   This information is not intended to replace advice given to you by your health care provider. Make sure you discuss any questions you have with your health care provider.   Document Released: 07/31/2005 Document Revised: 10/23/2011 Document Reviewed: 12/28/2010 Elsevier Interactive Patient Education 2016 Elsevier Inc.  Sinus Headache A sinus headache happens when your sinuses become clogged or swollen. You may feel pain or pressure in your face, forehead, ears, or upper teeth. Sinus headaches can be mild or severe. HOME CARE  Take medicines only as told by your doctor.  If you were given an antibiotic medicine, finish all of it even if you start to feel better.  Use a nose spray if you feel stuffed up (congested).  If told, apply a warm, moist washcloth to your face to help lessen pain. GET HELP IF:  You get headaches more than one time each week.  Light or sound bothers you.  You have a fever.  You feel sick to your stomach (nauseous) or you throw up (vomit).  Your headaches do not get better with treatment. GET HELP RIGHT AWAY IF:  You have trouble seeing.  You suddenly have very bad pain in your face or head.  You start to twitch or shake (seizure).  You are confused.  You have a stiff neck.   This information is not intended to replace advice given to you by your health care provider. Make sure you discuss any questions you have with your health care provider.   Document Released: 11/30/2010 Document Revised: 12/15/2014 Document Reviewed: 07/27/2014 Elsevier Interactive Patient Education 2016 [employer].  Sinusitis, Adult Sinusitis is redness, soreness, and puffiness (inflammation) of the air pockets in the bones of your face (sinuses). The redness, soreness, and puffiness can cause air and mucus to get trapped in your sinuses. This can allow germs to grow and cause an infection.  HOME CARE   Drink enough fluids to keep  your pee (urine) clear or pale yellow.  Use a humidifier in your home.  Run a hot shower to create steam in the bathroom. Sit in the bathroom with the door closed. Breathe in the steam 3-4 times a day.  Put a warm, moist washcloth on your face 3-4 times a day, or as told by your doctor.  Use salt water sprays (saline sprays) to wet the thick fluid in your nose. This can help the sinuses drain.  Only take medicine as told by your doctor. GET HELP RIGHT AWAY IF:   Your pain gets worse.  You have very bad headaches.  You are sick to your stomach (nauseous).  You throw up (vomit).  You are very sleepy (drowsy) all the time.  Your face is puffy (swollen).  Your vision changes.  You have a stiff neck.  You have trouble breathing. MAKE SURE YOU:   Understand these instructions.  Will watch your condition.  Will get help right away if you are not doing well or get worse.   This information is not intended to replace advice given to you by your health care provider. Make sure you discuss any questions you have with your health care provider.   Document Released: 01/17/2008 Document Revised: 08/21/2014 Document Reviewed: 03/05/2012 Elsevier Interactive Patient Education [employer]

## 2015-12-06 NOTE — ED Notes (Signed)
[facility] with a complaint of a headache with photosensitivity x 2 days and N/V that started earlier today.

## 2015-12-06 NOTE — ED Notes (Addendum)
Patient placed on a 20 minute observation after injections.

## 2015-12-06 NOTE — ED Provider Notes (Signed)
CSN: [id number]     Arrival date & time 12/06/15  1800 History   First MD Initiated Contact with Patient 12/06/15 1850     Chief Complaint  Patient presents with  . Migraine  . Nausea  . Emesis   (Consider location/radiation/quality/duration/timing/severity/associated sxs/prior Treatment) HPI Comments: 50 year old female complainingof a headache this started 2 days ago. Primarily in thefrontal maxillary sinus area. Early this morning the headache became global and was associated with nausea and vomiting and photophobia. She states she does not have a history of "bad headaches". No history of migraine headache. She does have a history of sinus infections.This is not associated with abdominal pain or genitourinary symptoms. No fevers. o lethargy. Denies problems with vision, speech, hearing, swallowing, focal paresthesias or muscle weakness. She does feel shaky. Denies problems with confusion, disorientation or memory.  In regards to patient's allergies these are primarily gastrointestinal side effects She states she has no true allergic reaction to the medications.     Past Medical History  Diagnosis Date  . Asthma   . Kidney stone    History reviewed. No pertinent past surgical history. History reviewed. No pertinent family history. Social History  Substance Use Topics  . Smoking status: Current Every Day Smoker  . Smokeless tobacco: None  . Alcohol Use: Yes   OB History    No data available     Review of Systems  Constitutional: Positive for activity change. Negative for fever.  HENT: Positive for congestion and sinus pressure. Negative for dental problem, ear pain, sore throat, trouble swallowing and voice change.   Eyes: Positive for photophobia. Negative for pain, discharge and visual disturbance.  Respiratory: Negative.   Cardiovascular: Negative for chest pain and leg swelling.  Gastrointestinal: Positive for nausea and vomiting. Negative for abdominal pain and  abdominal distention.  Genitourinary: Negative.   Musculoskeletal: Negative for back pain, neck pain and neck stiffness.  Skin: Negative.   Neurological: Positive for headaches. Negative for dizziness, seizures, syncope, facial asymmetry, speech difficulty and numbness.  Psychiatric/Behavioral: Negative.     Allergies  Aspirin; Codeine; Motrin; and Penicillins  Home Medications   Prior to Admission medications   Medication Sig Start Date End Date Taking? Authorizing Provider  ALPRAZolam Prudy Feeler(XANAX) 1 MG tablet Take 1 mg by mouth 2 (two) times daily as needed for anxiety.    Yes Historical Provider, MD  citalopram (CELEXA) 10 MG tablet Take 10 mg by mouth daily.   Yes Historical Provider, MD  albuterol (PROVENTIL HFA;VENTOLIN HFA) 108 (90 BASE) MCG/ACT inhaler Inhale 2 puffs into the lungs every 6 (six) hours as needed for wheezing. 12/12/12   Linna HoffJames D Kindl, MD  HYDROcodone-acetaminophen (NORCO/VICODIN) 5-325 MG tablet Take 1-2 tablets by mouth every 6 hours as needed for pain and/or cough. 07/05/15   Nicole Pisciotta, PA-C  ibuprofen (ADVIL,MOTRIN) 200 MG tablet Take 400 mg by mouth daily as needed for moderate pain.     Historical Provider, MD  levofloxacin (LEVAQUIN) 500 MG tablet Take 1 tablet (500 mg total) by mouth daily. 12/06/15   Hayden Rasmussenavid Maila Dukes, NP  ondansetron (ZOFRAN) 4 MG tablet Take 1 tablet (4 mg total) by mouth every 6 (six) hours. 12/06/15   Hayden Rasmussenavid Makya Yurko, NP  traMADol (ULTRAM) 50 MG tablet Take 1 tablet (50 mg total) by mouth every 6 (six) hours as needed. 12/06/15   Hayden Rasmussenavid Elisabel Hanover, NP   Meds Ordered and Administered this Visit   Medications  ketorolac (TORADOL) 30 MG/ML injection 30 mg (not administered)  dexamethasone (  DECADRON) injection 5 mg (not administered)  ondansetron (ZOFRAN-ODT) disintegrating tablet 4 mg (not administered)    BP 151/104 mmHg  Pulse 117  Temp(Src) 98.1 F (36.7 C) (Oral)  Resp 16  SpO2 99%  LMP 05/04/2013 No data found.   Physical Exam   Constitutional: She is oriented to person, place, and time. She appears well-developed. No distress.  Gaunt, thin.  HENT:  Head: Normocephalic and atraumatic.  Mouth/Throat: No oropharyngeal exudate.  Bilateral TMs are normal. No hemotympanum. Oropharynx is clear and moist. No exudates, swelling or erythema  Eyes: Conjunctivae and EOM are normal. Pupils are equal, round, and reactive to light. Right eye exhibits no discharge. Left eye exhibits no discharge.  Neck: Normal range of motion. Neck supple.  Cardiovascular: Normal rate, regular rhythm, normal heart sounds and intact distal pulses.   Pulmonary/Chest: Effort normal. No respiratory distress.  Few scattered faint wheezes. Fair to good chest expansion. Breath sounds in all lung fields.  Abdominal: Soft. Bowel sounds are normal. She exhibits no distension. There is no tenderness. There is no rebound and no guarding.  Musculoskeletal: She exhibits no edema.  Lymphadenopathy:    She has no cervical adenopathy.  Neurological: She is alert and oriented to person, place, and time. She has normal strength. No cranial nerve deficit or sensory deficit. She exhibits normal muscle tone. Coordination normal.  Skin: Skin is warm and dry.  Psychiatric: She has a normal mood and affect.  Nursing note and vitals reviewed.   ED Course  Procedures (including critical care time)  Labs Review Labs Reviewed - No data to display  Imaging Review No results found.   Visual Acuity Review  Right Eye Distance:   Left Eye Distance:   Bilateral Distance:    Right Eye Near:   Left Eye Near:    Bilateral Near:         MDM   1. Acute maxillary sinusitis, recurrence not specified   2. Acute nonintractable headache, unspecified headache type   3. Non-intractable vomiting with nausea, vomiting of unspecified type    For any worsening, new symptoms or problems such as worsening headache, persistent vomiting after taking medication, change in  vision, slurring of speech, problems hearing or swallowing or weakness on one side of the body, numbness, unusual sleepiness, confusion, problems with memory or any other abnormal changes or new symptoms come directly to the emergency department promptly. Follow with your doctor next 2 days. Need to have BP rechecked.  Meds ordered this encounter  Medications  . ketorolac (TORADOL) 30 MG/ML injection 30 mg    Sig:   . dexamethasone (DECADRON) injection 5 mg    Sig:   . ondansetron (ZOFRAN-ODT) disintegrating tablet 4 mg    Sig:   . levofloxacin (LEVAQUIN) 500 MG tablet    Sig: Take 1 tablet (500 mg total) by mouth daily.    Dispense:  7 tablet    Refill:  0    Order Specific Question:  Supervising Provider    Answer:  Linna Hoff [phone]  . ondansetron (ZOFRAN) 4 MG tablet    Sig: Take 1 tablet (4 mg total) by mouth every 6 (six) hours.    Dispense:  12 tablet    Refill:  0    Order Specific Question:  Supervising Provider    Answer:  Linna Hoff [phone]  . traMADol (ULTRAM) 50 MG tablet    Sig: Take 1 tablet (50 mg total) by mouth every 6 (six) hours as  needed.    Dispense:  15 tablet    Refill:  0    Order Specific Question:  Supervising Provider    Answer:  Linna Hoff [[id number]]   DTr with pt    Hayden Rasmussen, NP 12/06/15 2013

## 2016-01-13 IMAGING — US US RENAL
1 series · 14 of 25 positions shown · non-contrast
Comparison: CT 06/25/2013

CLINICAL DATA: Congenital UPJ obstruction.

EXAM:
RENAL/URINARY TRACT ULTRASOUND COMPLETE

[Series 1: us renal · 0.21mm/px · 14 of 39 slices shown]
[im 1/39]
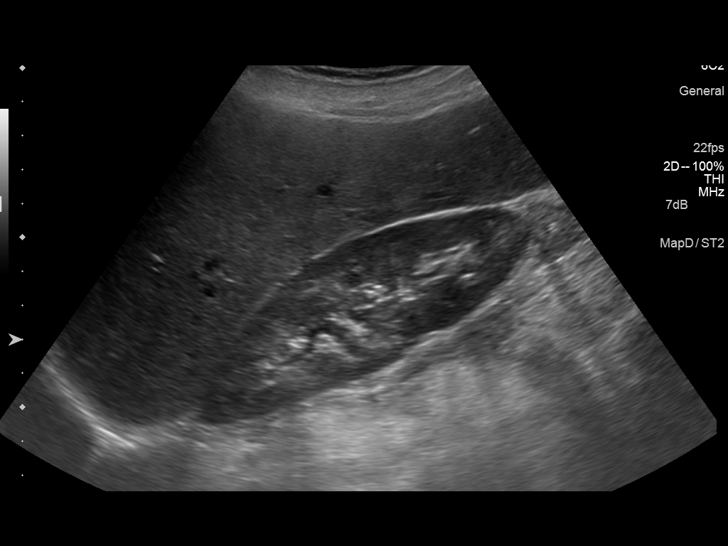
[im 4/39]
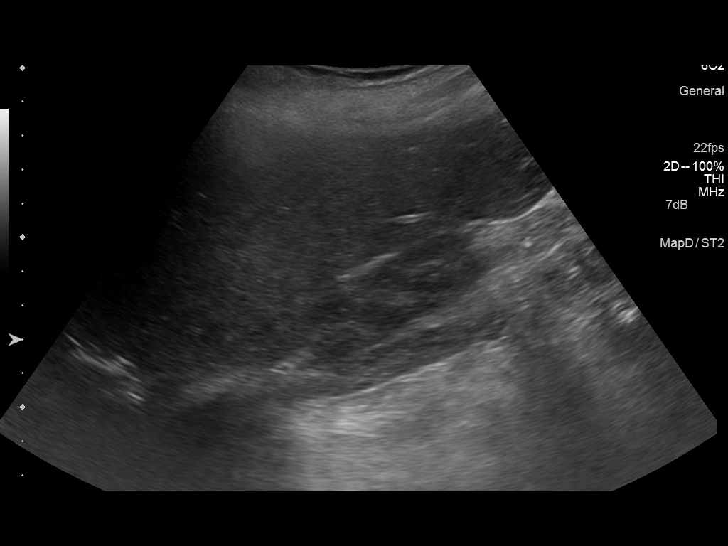
[im 7/39]
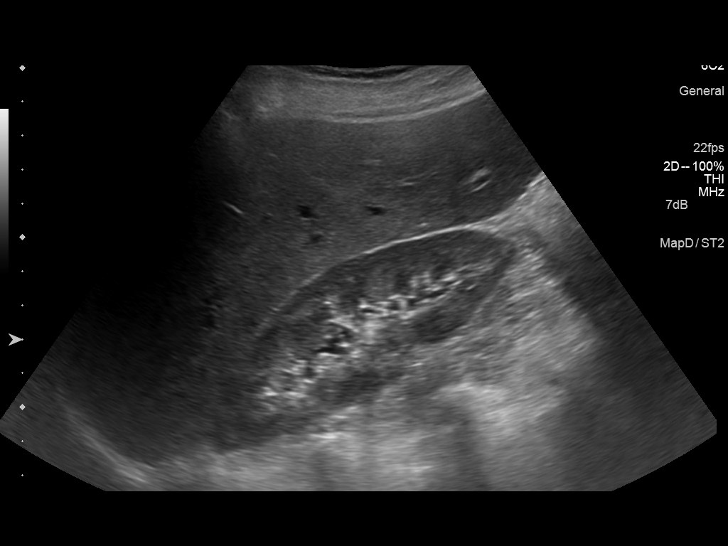
[im 10/39]
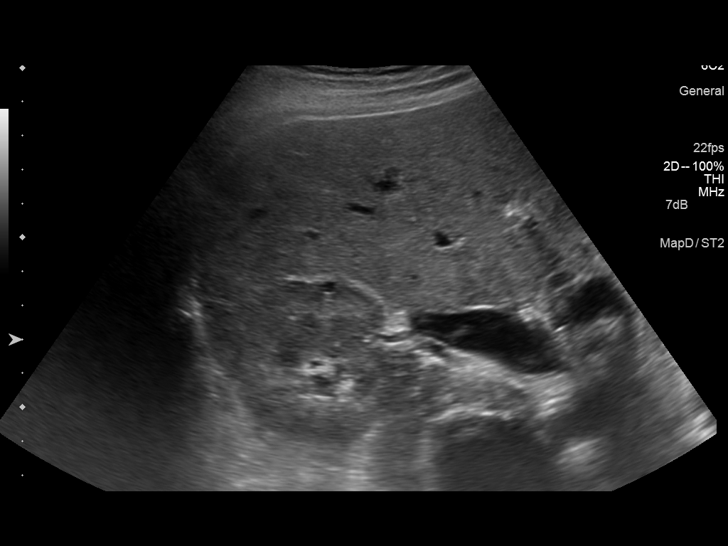
[im 13/39]
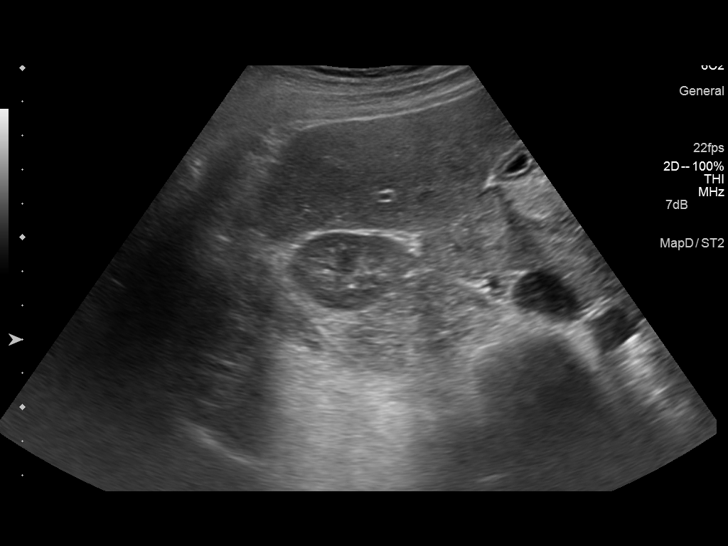
[im 15/39]
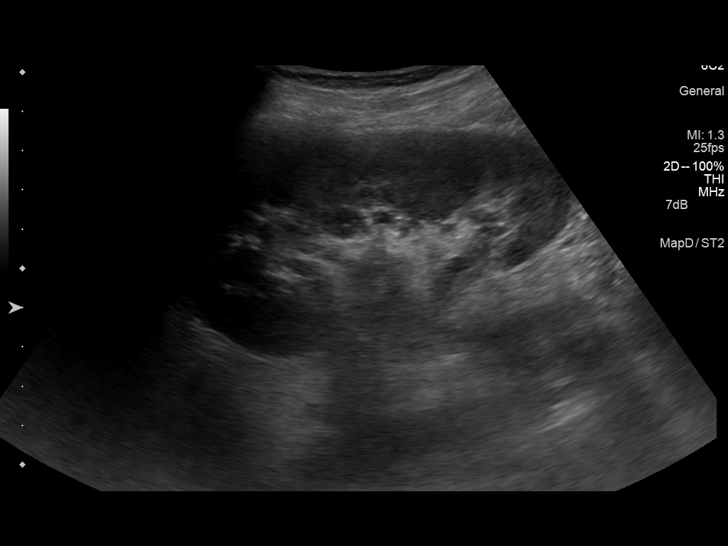
[im 18/39]
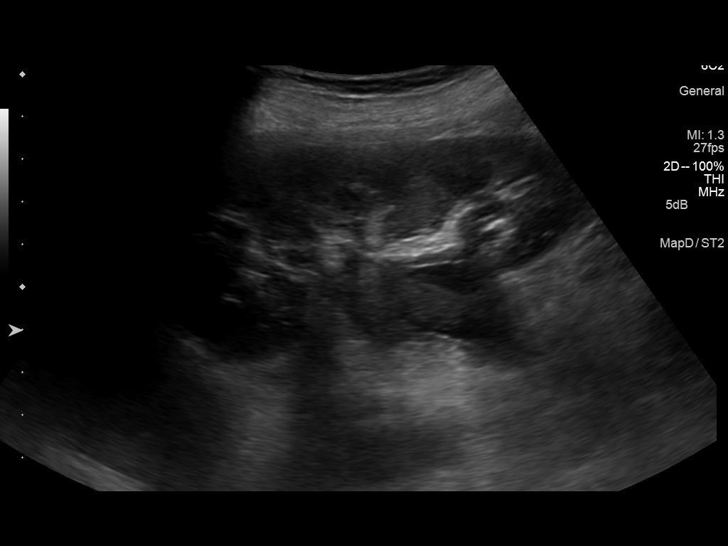
[im 21/39]
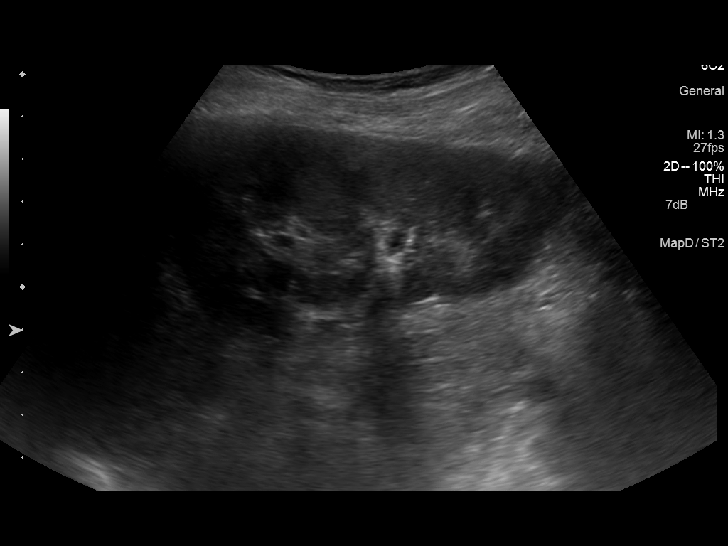
[im 24/39]
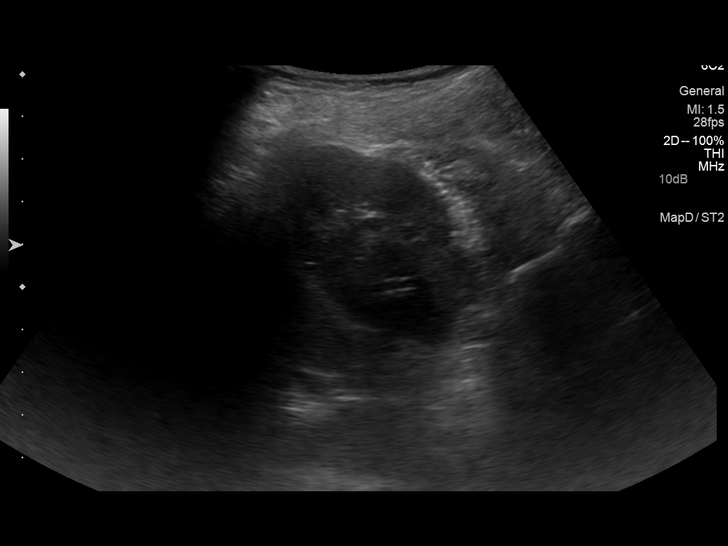
[im 26/39]
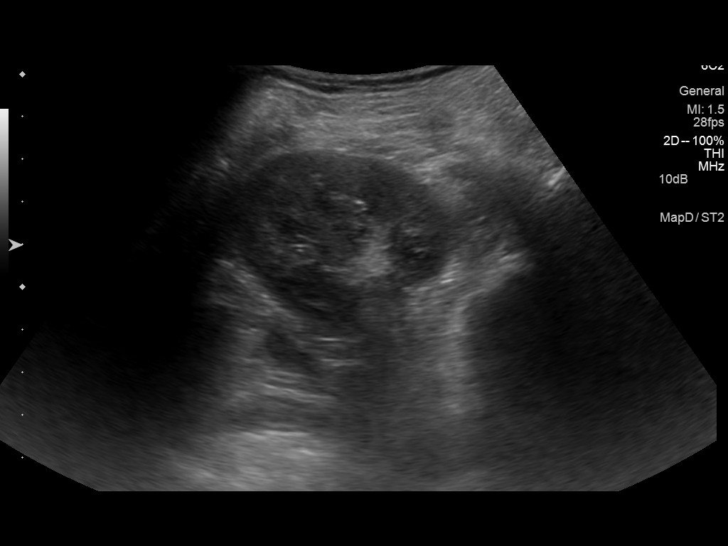
[im 29/39]
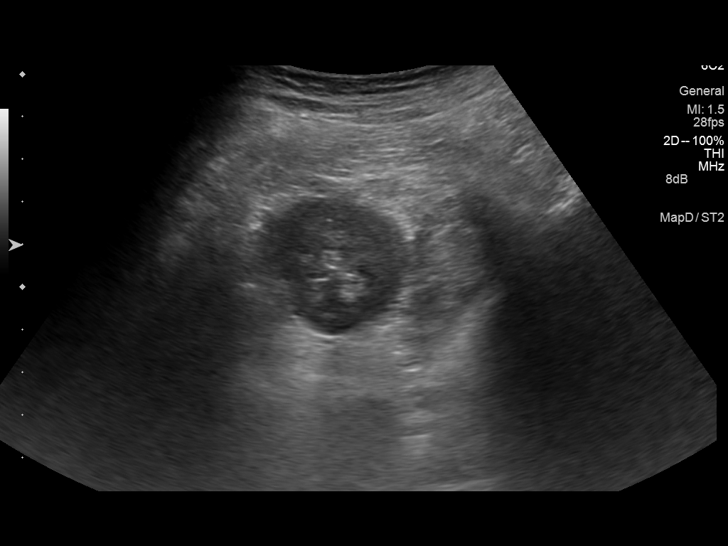
[im 32/39]
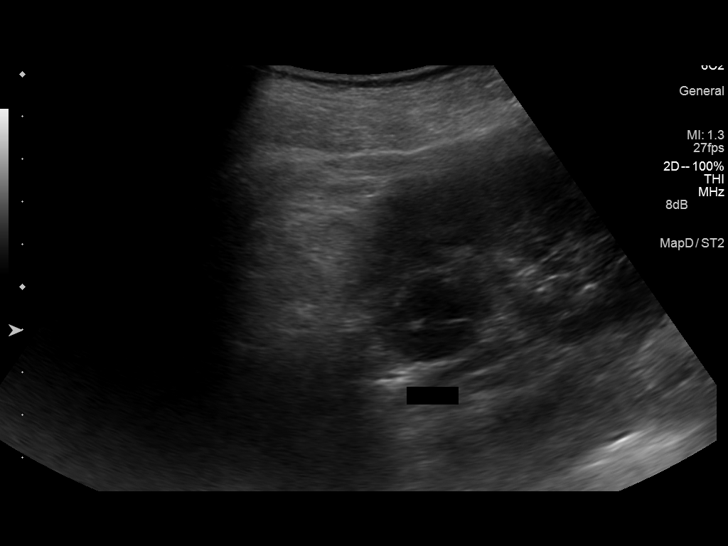
[im 35/39]
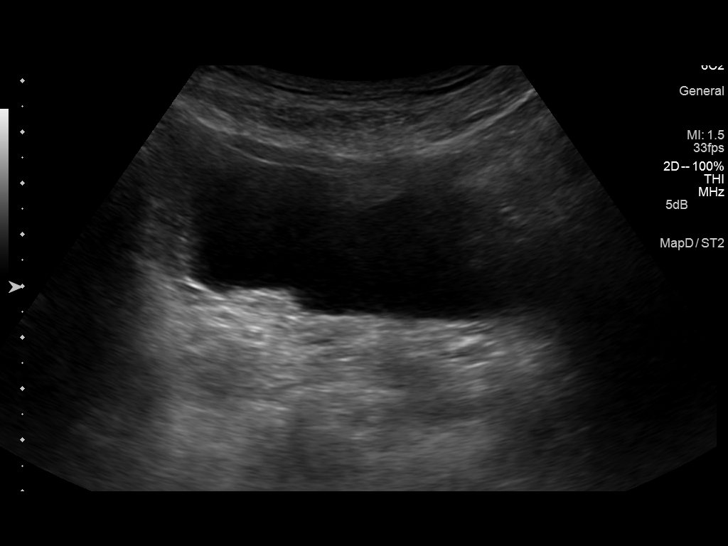
[im 39/39]
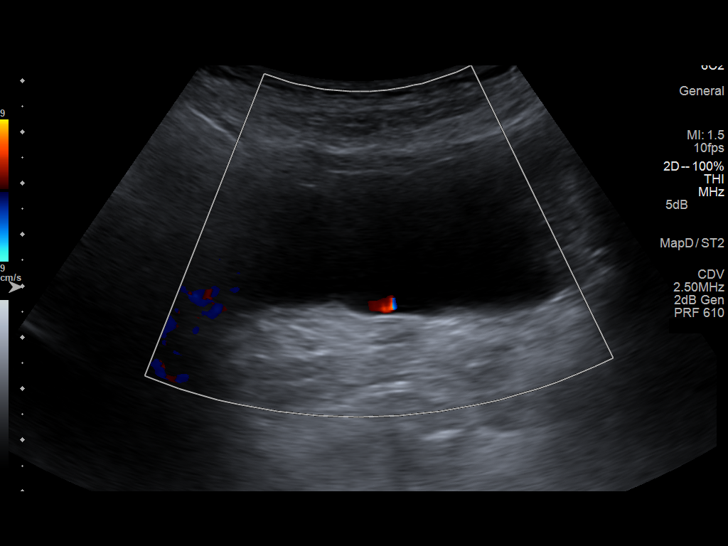

[14 of 25 positions shown; findings below may reference images not displayed]

FINDINGS: Right Kidney:

Length: 11.2 cm. Echogenicity within normal limits. No mass or
hydronephrosis visualized.

Left Kidney:

Length: 10.3 cm. Left renal pelvis is dilated measuring up to
cm. There is an echogenic structure in the left kidney upper pole
consistent with a ureter stent. No significant dilatation of the
left renal calices.

Bladder:

Small amount of fluid in the urinary bladder. There appears to be
bilateral ureter jets. A ureter stent is not identified in the
bladder.
IMPRESSION: No hydronephrosis.

The left renal pelvis is dilated but no evidence for caliectasis.
There is an echogenic structure in the left kidney which is
suggestive for a ureter stent but a stent is not identified in the
bladder.

## 2016-04-17 ENCOUNTER — Ambulatory Visit (HOSPITAL_COMMUNITY)
Admission: EM | Admit: 2016-04-17 | Discharge: 2016-04-17 | Disposition: A | Discharge status: Home / Self Care | Payer: Self-pay | Attending: Radiology | Admitting: Radiology

## 2016-04-17 ENCOUNTER — Encounter (HOSPITAL_COMMUNITY): Payer: Self-pay | Admitting: Emergency Medicine

## 2016-04-17 DIAGNOSIS — J441 Chronic obstructive pulmonary disease with (acute) exacerbation: Secondary | ICD-10-CM

## 2016-04-17 MED ORDER — IPRATROPIUM-ALBUTEROL 0.5-2.5 (3) MG/3ML IN SOLN
3.0000 mL | Freq: Once | RESPIRATORY_TRACT | Status: AC
  Administered 2016-04-17: 3 mL via RESPIRATORY_TRACT

## 2016-04-17 MED ORDER — ALBUTEROL SULFATE HFA 108 (90 BASE) MCG/ACT IN AERS: 2.0000 | INHALATION_SPRAY | Freq: Four times a day (QID) | RESPIRATORY_TRACT | 1 refills | Status: DC | PRN

## 2016-04-17 MED ORDER — IPRATROPIUM-ALBUTEROL 0.5-2.5 (3) MG/3ML IN SOLN
RESPIRATORY_TRACT | Status: AC
  Filled 2016-04-17: qty 3

## 2016-04-17 NOTE — Discharge Instructions (Signed)
Continue to use inhaler as directed.

## 2016-04-17 NOTE — ED Triage Notes (Signed)
[facility] a complaint of shortness of breath and chest wall pain x 4 days. The patient stated that she had a hx of asthma and felt it was exacerbated. The patient presented in a tripod position with accessory muscle usage.

## 2016-04-17 NOTE — ED Provider Notes (Signed)
CSN: [id number]     Arrival date & time 04/17/16  1136 History   First MD Initiated Contact with Patient 04/17/16 1153     Chief Complaint  Patient presents with  . Shortness of Breath   (Consider location/radiation/quality/duration/timing/severity/associated sxs/prior Treatment) 50 y.o. female presents with asthma  Presents with exacerbation X 3 days with fevers 2 days prior. Condition is acute on chronicnature. Condition is made better by nothing Condition is made worse by nothing. Patient denies any relief from albuterol inhaler prior to there arrival at this facility. Patient is very anxious and states that she took her anxiety medication en route to this facility.   Patient has improved work of breathing with 1 duo neb. Second duo neb ordered.  Breath sounds improved. Patient reports breathing better.         Past Medical History:  Diagnosis Date  . Asthma   . Kidney stone    History reviewed. No pertinent surgical history. History reviewed. No pertinent family history. Social History  Substance Use Topics  . Smoking status: Current Every Day Smoker  . Smokeless tobacco: Not on file  . Alcohol use Yes   OB History    No data available     Review of Systems  Constitutional: Positive for fever.  HENT: Negative.   Respiratory: Positive for cough, chest tightness and shortness of breath.     Allergies  Aspirin; Codeine; Motrin [ibuprofen]; and Penicillins  Home Medications   Prior to Admission medications   Medication Sig Start Date End Date Taking? Authorizing Provider  ALPRAZolam Prudy Feeler(XANAX) 1 MG tablet Take 1 mg by mouth 2 (two) times daily as needed for anxiety.    Yes Historical Provider, MD  citalopram (CELEXA) 10 MG tablet Take 10 mg by mouth daily.   Yes Historical Provider, MD  albuterol (PROVENTIL HFA;VENTOLIN HFA) 108 (90 Base) MCG/ACT inhaler Inhale 2 puffs into the lungs every 6 (six) hours as needed for wheezing. 04/17/16   Alene MiresJennifer C Dorene Bruni, NP   HYDROcodone-acetaminophen (NORCO/VICODIN) 5-325 MG tablet Take 1-2 tablets by mouth every 6 hours as needed for pain and/or cough. 07/05/15   Nicole Pisciotta, PA-C  ibuprofen (ADVIL,MOTRIN) 200 MG tablet Take 400 mg by mouth daily as needed for moderate pain.     Historical Provider, MD  levofloxacin (LEVAQUIN) 500 MG tablet Take 1 tablet (500 mg total) by mouth daily. 12/06/15   Hayden Rasmussenavid Mabe, NP  ondansetron (ZOFRAN) 4 MG tablet Take 1 tablet (4 mg total) by mouth every 6 (six) hours. 12/06/15   Hayden Rasmussenavid Mabe, NP  traMADol (ULTRAM) 50 MG tablet Take 1 tablet (50 mg total) by mouth every 6 (six) hours as needed. 12/06/15   Hayden Rasmussenavid Mabe, NP   Meds Ordered and Administered this Visit   Medications  ipratropium-albuterol (DUONEB) 0.5-2.5 (3) MG/3ML nebulizer solution 3 mL (3 mLs Nebulization Given 04/17/16 1158)  ipratropium-albuterol (DUONEB) 0.5-2.5 (3) MG/3ML nebulizer solution 3 mL (3 mLs Nebulization Given 04/17/16 1224)    BP 143/86 (BP Location: Left Arm)   Pulse 110   Temp 97.4 F (36.3 C) (Oral)   Resp 16   LMP 05/04/2013   SpO2 100%  No data found.   Physical Exam  Constitutional: She is oriented to person, place, and time. She appears well-developed and well-nourished.  Cardiovascular: Regular rhythm.   Pulmonary/Chest: She has wheezes (inspiratory).  tachypnea  with respiratory tightness and inspiratory wheezing.   Neurological: She is oriented to person, place, and time.  Skin: Skin is warm.  Urgent Care Course   Clinical Course    Procedures (including critical care time)  Labs Review Labs Reviewed - No data to display  Imaging Review No results found.   Visual Acuity Review  Right Eye Distance:   Left Eye Distance:   Bilateral Distance:    Right Eye Near:   Left Eye Near:    Bilateral Near:         MDM   1. COPD exacerbation (HCC)       Alene Mires, NP 04/17/16 1244

## 2016-04-19 ENCOUNTER — Encounter (HOSPITAL_COMMUNITY): Payer: Self-pay | Admitting: Emergency Medicine

## 2016-04-19 ENCOUNTER — Emergency Department (HOSPITAL_COMMUNITY): Payer: Self-pay

## 2016-04-19 ENCOUNTER — Emergency Department (HOSPITAL_COMMUNITY)
Admission: EM | Admit: 2016-04-19 | Discharge: 2016-04-20 | Disposition: A | Discharge status: Home / Self Care | Payer: Self-pay | Attending: Emergency Medicine | Admitting: Emergency Medicine

## 2016-04-19 DIAGNOSIS — F17200 Nicotine dependence, unspecified, uncomplicated: Secondary | ICD-10-CM | POA: Insufficient documentation

## 2016-04-19 DIAGNOSIS — R079 Chest pain, unspecified: Secondary | ICD-10-CM

## 2016-04-19 DIAGNOSIS — R0602 Shortness of breath: Secondary | ICD-10-CM

## 2016-04-19 DIAGNOSIS — J45901 Unspecified asthma with (acute) exacerbation: Secondary | ICD-10-CM | POA: Insufficient documentation

## 2016-04-19 DIAGNOSIS — Z79899 Other long term (current) drug therapy: Secondary | ICD-10-CM | POA: Insufficient documentation

## 2016-04-19 HISTORY — DX: Hypertrophy of kidney: N28.81

## 2016-04-19 LAB — BASIC METABOLIC PANEL
Anion gap: 9 (ref 5–15)
BUN: 19 mg/dL (ref 6–20)
CALCIUM: 9.6 mg/dL (ref 8.9–10.3)
CO2: 26 mmol/L (ref 22–32)
CREATININE: 0.73 mg/dL (ref 0.44–1.00)
Chloride: 103 mmol/L (ref 101–111)
GFR calc Af Amer: >60 mL/min (ref >60)
Glucose, Bld: 94 mg/dL (ref 65–99)
POTASSIUM: 4 mmol/L (ref 3.5–5.1)
SODIUM: 138 mmol/L (ref 135–145)

## 2016-04-19 LAB — CBC
HEMATOCRIT: 40.6 % (ref 36.0–46.0)
Hemoglobin: 14.1 g/dL (ref 12.0–15.0)
MCH: 31.3 pg (ref 26.0–34.0)
MCHC: 34.7 g/dL (ref 30.0–36.0)
MCV: 90 fL (ref 78.0–100.0)
PLATELETS: 255 10*3/uL (ref 150–400)
RBC: 4.51 MIL/uL (ref 3.87–5.11)
RDW: 13.3 % (ref 11.5–15.5)
WBC: 7.4 10*3/uL (ref 4.0–10.5)

## 2016-04-19 LAB — I-STAT TROPONIN, ED: TROPONIN I, POC: 0 ng/mL (ref 0.00–0.08)

## 2016-04-19 MED ORDER — METHYLPREDNISOLONE SODIUM SUCC 125 MG IJ SOLR
125.0000 mg | Freq: Once | INTRAMUSCULAR | Status: AC
  Administered 2016-04-19: 125 mg via INTRAVENOUS
  Filled 2016-04-19: qty 2

## 2016-04-19 MED ORDER — IPRATROPIUM BROMIDE 0.02 % IN SOLN
0.5000 mg | Freq: Once | RESPIRATORY_TRACT | Status: AC
  Administered 2016-04-19: 0.5 mg via RESPIRATORY_TRACT
  Filled 2016-04-19: qty 2.5

## 2016-04-19 MED ORDER — ALBUTEROL SULFATE (2.5 MG/3ML) 0.083% IN NEBU
5.0000 mg | INHALATION_SOLUTION | Freq: Once | RESPIRATORY_TRACT | Status: AC
  Administered 2016-04-19: 5 mg via RESPIRATORY_TRACT
  Filled 2016-04-19: qty 6

## 2016-04-19 MED ORDER — SODIUM CHLORIDE 0.9 % IV SOLN
INTRAVENOUS | Status: DC
  Administered 2016-04-19: 21:00:00 via INTRAVENOUS

## 2016-04-19 NOTE — ED Notes (Signed)
Pt reports improvement in breathing after albuterol/atrovent treatment. Pt also reporting decrease in pain to 3/10. Awaiting disposition by provider.

## 2016-04-19 NOTE — ED Triage Notes (Addendum)
Pt reports CP and SOB since this weekend. Pt has hx of asthma and does smoke. Went to UC for a few days ago for similar. Some dizziness and nausea.

## 2016-04-19 NOTE — ED Provider Notes (Signed)
[facility]-EMERGENCY DEPT Provider Note   CSN: [id number] Arrival date & time: 04/19/16  1757  By signing my name below, I, Christy Sartorius, attest that this documentation has been prepared under the direction and in the presence of [employer], PA-C. Electronically Signed: Christy Sartorius, ED Scribe. 04/19/16. 9:02 PM.  History   Chief Complaint Chief Complaint  Patient presents with  . Chest Pain  . Shortness of Breath   The history is provided by the patient and medical records. No language interpreter was used.    HPI Comments:  Jeanne Huynh is a 50 y.o. female with a history of asthma who presents to the Emergency Department complaining of CP and SOB beginning two days ago.  She states if feels like somebody is sitting on her chest.  She adds that this pain feels similar to a past episode of pneumonia.  She notes associated cough, sore throat and post nasal drainage, but denies fever or chills.  She states her sore throat is worse in the morning and feels raw.  She was seen in the ED two days ago and diagnosed with COPD exacerbation.  She states her pain was worse after being given a breathing treatment in the ED.  She was instructed to use her inhaler more frequently and has been doing so without relief.  She reports she was not given any steroids at that visit. Her mother had a history of heart issues beginning in her 51s.  She has never has to be admitted or intubated for her asthma.  She denies swelling in her legs and feet, hx of DVT, palpitations, OCP usage, recent immobilization.  Taking ibuprofen or codeine causes her to have nausea.    Past Medical History:  Diagnosis Date  . Asthma   . Enlarged kidney   . Kidney stone     Patient Active Problem List   Diagnosis Date Noted  . Pyelonephritis 06/25/2013    History reviewed. No pertinent surgical history.  OB History    No data available       Home Medications    Prior to Admission medications     Medication Sig Start Date End Date Taking? Authorizing Provider  albuterol (PROVENTIL HFA;VENTOLIN HFA) 108 (90 Base) MCG/ACT inhaler Inhale 2 puffs into the lungs every 6 (six) hours as needed for wheezing. 04/17/16  Yes Alene Mires, NP  ALPRAZolam Prudy Feeler) 1 MG tablet Take 1 mg by mouth 4 (four) times daily as needed for anxiety.    Yes Historical Provider, MD  citalopram (CELEXA) 20 MG tablet Take 20 mg by mouth at bedtime.   Yes Historical Provider, MD  predniSONE (DELTASONE) 20 MG tablet Take 2 tablets (40 mg total) by mouth daily. 04/20/16   Dahlia Client Padraic Marinos, PA-C    Family History History reviewed. No pertinent family history.  Social History Social History  Substance Use Topics  . Smoking status: Current Every Day Smoker  . Smokeless tobacco: Never Used  . Alcohol use Yes     Allergies   Aspirin; Codeine; Motrin [ibuprofen]; and Penicillins   Review of Systems Review of Systems  HENT: Positive for postnasal drip and sore throat.   Respiratory: Positive for shortness of breath.   Cardiovascular: Positive for chest pain. Negative for leg swelling.  All other systems reviewed and are negative.    Physical Exam Updated Vital Signs BP 148/97   Pulse 61   Temp 97.6 F (36.4 C) (Oral)   Resp 14   Ht 5\' 6"  (  1.676 m)   Wt 107 lb (48.5 kg)   LMP 05/04/2013   SpO2 100%   BMI 17.27 kg/m   Physical Exam  Constitutional: She appears well-developed and well-nourished. No distress.  Awake, alert, nontoxic appearance  HENT:  Head: Normocephalic and atraumatic.  Right Ear: Tympanic membrane, external ear and ear canal normal.  Left Ear: Tympanic membrane, external ear and ear canal normal.  Nose: Mucosal edema and rhinorrhea present. No epistaxis. Right sinus exhibits no maxillary sinus tenderness and no frontal sinus tenderness. Left sinus exhibits no maxillary sinus tenderness and no frontal sinus tenderness.  Mouth/Throat: Uvula is midline, oropharynx is clear  and moist and mucous membranes are normal. Mucous membranes are not pale and not cyanotic. No oropharyngeal exudate, posterior oropharyngeal edema, posterior oropharyngeal erythema or tonsillar abscesses.  Eyes: Conjunctivae are normal. Pupils are equal, round, and reactive to light. No scleral icterus.  Neck: Normal range of motion and full passive range of motion without pain. Neck supple.  Cardiovascular: Normal rate, regular rhythm and intact distal pulses.   Pulmonary/Chest: Accessory muscle usage ( mild) present. No stridor. Tachypnea noted. No respiratory distress. She has decreased breath sounds ( throughout). She has wheezes ( fine, expiratory, throughout).  Equal chest expansion  Abdominal: Soft. Bowel sounds are normal. She exhibits no mass. There is no tenderness. There is no rebound and no guarding.  Musculoskeletal: Normal range of motion. She exhibits no edema.  Lymphadenopathy:    She has no cervical adenopathy.  Neurological: She is alert.  Speech is clear and goal oriented Moves extremities without ataxia  Skin: Skin is warm and dry. No rash noted. She is not diaphoretic.  Psychiatric: She has a normal mood and affect.  Nursing note and vitals reviewed.    ED Treatments / Results   DIAGNOSTIC STUDIES:  Oxygen Saturation is 100% on RA, NML by my interpretation.    COORDINATION OF CARE:  9:02 PM Discussed treatment plan with pt at bedside and pt agreed to plan.  Labs (all labs ordered are listed, but only abnormal results are displayed) Labs Reviewed  BASIC METABOLIC PANEL  CBC  I-STAT TROPOININ, ED    EKG  EKG Interpretation  Date/Time:  Wednesday April 19 2016 18:10:28 EDT Ventricular Rate:  81 PR Interval:    QRS Duration: 81 QT Interval:  373 QTC Calculation: 431 R Axis:   93 Text Interpretation:  Sinus rhythm Borderline right axis deviation Baseline wander in lead(s) V5 Confirmed by Rubin Payor  MD, Harrold Donath [phone]) on 04/20/2016 12:35:25 AM        Radiology Dg Chest 2 View  Result Date: 04/19/2016 CLINICAL DATA:  50 year old female with chest pain EXAM: CHEST  2 VIEW COMPARISON:  Chest radiograph dated 07/05/2015 FINDINGS: Two views of chest demonstrate hyper expansion of the lungs with flattening of the diaphragms. There is no focal consolidation, pleural effusion, or pneumothorax. The cardiac silhouette is within normal limits with no acute osseous pathology. IMPRESSION: No active cardiopulmonary disease. Electronically Signed   By: Elgie Collard M.D.   On: 04/19/2016 18:36    Procedures Procedures (including critical care time)  Medications Ordered in ED Medications  0.9 %  sodium chloride infusion ( Intravenous New Bag/Given 04/19/16 2110)  methylPREDNISolone sodium succinate (SOLU-MEDROL) 125 mg/2 mL injection 125 mg (125 mg Intravenous Given 04/19/16 2111)  albuterol (PROVENTIL) (2.5 MG/3ML) 0.083% nebulizer solution 5 mg (5 mg Nebulization Given 04/19/16 2107)  ipratropium (ATROVENT) nebulizer solution 0.5 mg (0.5 mg Nebulization Given 04/19/16 2107)  albuterol (PROVENTIL) (2.5 MG/3ML) 0.083% nebulizer solution 5 mg (5 mg Nebulization Given 04/19/16 2339)  ipratropium (ATROVENT) nebulizer solution 0.5 mg (0.5 mg Nebulization Given 04/19/16 2339)     Initial Impression / Assessment and Plan / ED Course  I have reviewed the triage vital signs and the nursing notes.  Pertinent labs & imaging results that were available during my care of the patient were reviewed by me and considered in my medical decision making (see chart for details).  Clinical Course  Value Comment By Time  EKG 12-Lead (Reviewed) Christy SartoriusAnastasia Kolousek 09/06 2022  DG Chest 2 View No acute infiltrate Dierdre ForthHannah Wanell Lorenzi, PA-C 09/06 2026  EKG 12-Lead Sinus Rhythm; no ischemic changes noted Dierdre ForthHannah Larhonda Dettloff, PA-C 09/06 2026   Labs reassuring including neg initial troponin Dierdre ForthHannah Claxton Levitz, PA-C 09/06 2027  BP: 163/90 VSS.  No tachycardia or hypotension Dierdre ForthHannah  Zayquan Bogard, PA-C 09/06 2027   Patient with significant improvement in symptoms. Clear and equal breath sounds at this time. Normal oxygen saturations with ambulation. She wishes for discharge home. Dahlia ClientHannah Kenden Brandt, PA-C 09/07 0033    11:04 PM  Pt reports improved breathing.  Non expiratory wheezes throughout but increased tidal volume.    12:15 AM Patient ambulated in ED with O2 saturations maintained >90, no current signs of respiratory distress. Lung exam improved after nebulizer treatment. Prednisone given in the ED and pt will be dc with 5 day burst. Pt states they are breathing at baseline. Chest pain is not likely of cardiac origin. 12-lead is reassuring and troponin is negative. Patient without risk factor for DVT/PE and is without tachycardia. Pt has been instructed to continue using prescribed medications and to speak with PCP about today's exacerbation.  Discussed reasons to return to the emergency department. Patient states understanding and is in agreement with the plan.   Final Clinical Impressions(s) / ED Diagnoses   Final diagnoses:  Asthma exacerbation  Shortness of breath  Chest pain, unspecified chest pain type    New Prescriptions New Prescriptions   PREDNISONE (DELTASONE) 20 MG TABLET    Take 2 tablets (40 mg total) by mouth daily.   I personally performed the services described in this documentation, which was scribed in my presence. The recorded information has been reviewed and is accurate.     Dahlia ClientHannah Somara Frymire, PA-C 04/20/16 [id number]    Benjiman CoreNathan Pickering, MD 04/21/16 0010

## 2016-04-19 NOTE — ED Notes (Signed)
PA at bedside.

## 2016-04-19 NOTE — ED Notes (Signed)
Pt reports improvement in breathing. Awaiting additional orders from provider.

## 2016-04-20 MED ORDER — PREDNISONE 20 MG PO TABS: 40.0000 mg | ORAL_TABLET | Freq: Every day | ORAL | 0 refills | Status: DC

## 2016-04-20 NOTE — ED Notes (Signed)
Pt reports understanding of discharge information. No questions at time of discharge 

## 2016-04-20 NOTE — Discharge Instructions (Signed)
1. Medications: albuterol inhaler, prednisone, usual home medications 2. Treatment: rest, drink plenty of fluids, begin OTC antihistamine (Zyrtec or Claritin)  3. Follow Up: Please followup with your primary doctor in 2-3 days for discussion of your diagnoses and further evaluation after today's visit; if you do not have a primary care doctor use the resource guide provided to find one; Please return to the ER for difficulty breathing, high fevers or worsening symptoms.

## 2017-07-11 ENCOUNTER — Encounter (HOSPITAL_COMMUNITY): Payer: Self-pay | Admitting: Emergency Medicine

## 2017-07-11 ENCOUNTER — Other Ambulatory Visit: Payer: Self-pay

## 2017-07-11 ENCOUNTER — Ambulatory Visit (HOSPITAL_COMMUNITY)
Admission: EM | Admit: 2017-07-11 | Discharge: 2017-07-11 | Disposition: A | Discharge status: Home / Self Care | Payer: Self-pay | Attending: Family Medicine | Admitting: Family Medicine

## 2017-07-11 DIAGNOSIS — L0201 Cutaneous abscess of face: Secondary | ICD-10-CM

## 2017-07-11 MED ORDER — LIDOCAINE-EPINEPHRINE (PF) 2 %-1:200000 IJ SOLN
INTRAMUSCULAR | Status: AC
  Filled 2017-07-11: qty 20

## 2017-07-11 MED ORDER — DOXYCYCLINE HYCLATE 100 MG PO TABS: 100.0000 mg | ORAL_TABLET | Freq: Two times a day (BID) | ORAL | 0 refills | Status: DC

## 2017-07-11 MED ORDER — TRAMADOL HCL 50 MG PO TABS: 50.0000 mg | ORAL_TABLET | Freq: Four times a day (QID) | ORAL | 0 refills | Status: DC | PRN

## 2017-07-11 NOTE — ED Triage Notes (Signed)
Pt c/o cyst on L side of face, right in front of L ear. Red and swollen.

## 2017-07-11 NOTE — Discharge Instructions (Signed)
Use warm washcloth every few hours to the cyst area today and tomorrow.

## 2017-07-11 NOTE — ED Provider Notes (Signed)
[facility]   [id number] 07/11/17 Arrival Time: 1128   SUBJECTIVE:  Jeanne Huynh is a 51 y.o. female who presents to the urgent care with complaint of cyst on L side of face, right in front of L ear. Red and swollen.  3 days of redness, but had chronic nodule there.     Past Medical History:  Diagnosis Date  . Asthma   . Enlarged kidney   . Kidney stone    Family History  Problem Relation Age of Onset  . Cancer Mother   . Heart failure Father    Social History   Socioeconomic History  . Marital status: Legally Separated    Spouse name: Not on file  . Number of children: Not on file  . Years of education: Not on file  . Highest education level: Not on file  Social Needs  . Financial resource strain: Not on file  . Food insecurity - worry: Not on file  . Food insecurity - inability: Not on file  . Transportation needs - medical: Not on file  . Transportation needs - non-medical: Not on file  Occupational History  . Not on file  Tobacco Use  . Smoking status: Current Every Day Smoker  . Smokeless tobacco: Never Used  Substance and Sexual Activity  . Alcohol use: Yes  . Drug use: Not on file  . Sexual activity: Not on file  Other Topics Concern  . Not on file  Social History Narrative  . Not on file   Current Meds  Medication Sig  . albuterol (PROVENTIL HFA;VENTOLIN HFA) 108 (90 Base) MCG/ACT inhaler Inhale 2 puffs into the lungs every 6 (six) hours as needed for wheezing.  Marland Kitchen. ALPRAZolam (XANAX) 1 MG tablet Take 1 mg by mouth 4 (four) times daily as needed for anxiety.   . citalopram (CELEXA) 20 MG tablet Take 20 mg by mouth at bedtime.   Allergies  Allergen Reactions  . Aspirin Other (See Comments)    "made me have a asthma attack"  . Codeine Nausea Only  . Motrin [Ibuprofen] Nausea Only    Cant take high amounts  . Penicillins Other (See Comments)    "lost 10 pounds while taking it" Has patient had a PCN reaction causing immediate rash,  facial/tongue/throat swelling, SOB or lightheadedness with hypotension: No Has patient had a PCN reaction causing severe rash involving mucus membranes or skin necrosis: No Has patient had a PCN reaction that required hospitalization No Has patient had a PCN reaction occurring within the last 10 years: No If all of the above answers are "NO", then may proceed with Cephalosporin use.       ROS: As per HPI, remainder of ROS negative.   OBJECTIVE:   Vitals:   07/11/17 1146  BP: 125/81  Pulse: 75  Resp: 14  Temp: 97.6 F (36.4 C)  TempSrc: Oral  SpO2: 97%     General appearance: alert; no distress Eyes: PERRL; EOMI; conjunctiva normal HENT: normocephalic; atraumatic; TMs normal, canal normal, external ears normal without trauma; nasal mucosa normal; oral mucosa normal Neck: supple Skin: warm and dry 1 cm red sub cutaneous cyst anterior to left tragus; I&D'd after 2% xylo with epi local Neurologic: normal gait; grossly normal Psychological: alert and cooperative; normal mood and affect      Labs:  Results for orders placed or performed during the hospital encounter of 04/19/16  Basic metabolic panel  Result Value Ref Range   Sodium 138 135 -  145 mmol/L   Potassium 4.0 3.5 - 5.1 mmol/L   Chloride 103 101 - 111 mmol/L   CO2 26 22 - 32 mmol/L   Glucose, Bld 94 65 - 99 mg/dL   BUN 19 6 - 20 mg/dL   Creatinine, Ser 1.610.73 0.44 - 1.00 mg/dL   Calcium 9.6 8.9 - 09.610.3 mg/dL   GFR calc non Af Amer >60 >60 mL/min   GFR calc Af Amer >60 >60 mL/min   Anion gap 9 5 - 15  CBC  Result Value Ref Range   WBC 7.4 4.0 - 10.5 K/uL   RBC 4.51 3.87 - 5.11 MIL/uL   Hemoglobin 14.1 12.0 - 15.0 g/dL   HCT 04.540.6 40.936.0 - 81.146.0 %   MCV 90.0 78.0 - 100.0 fL   MCH 31.3 26.0 - 34.0 pg   MCHC 34.7 30.0 - 36.0 g/dL   RDW 91.413.3 78.211.5 - 95.615.5 %   Platelets 255 150 - 400 K/uL  I-stat troponin, ED  Result Value Ref Range   Troponin i, poc 0.00 0.00 - 0.08 ng/mL   Comment 3            Labs  Reviewed - No data to display  No results found.     ASSESSMENT & PLAN:  1. Abscess of face     Meds ordered this encounter  Medications  . DISCONTD: traMADol (ULTRAM) 50 MG tablet    Sig: Take 1 tablet (50 mg total) by mouth every 6 (six) hours as needed.    Dispense:  15 tablet    Refill:  0  . doxycycline (VIBRA-TABS) 100 MG tablet    Sig: Take 1 tablet (100 mg total) by mouth 2 (two) times daily.    Dispense:  20 tablet    Refill:  0  . traMADol (ULTRAM) 50 MG tablet    Sig: Take 1 tablet (50 mg total) by mouth every 6 (six) hours as needed.    Dispense:  15 tablet    Refill:  0    Reviewed expectations re: course of current medical issues. Questions answered. Outlined signs and symptoms indicating need for more acute intervention. Patient verbalized understanding. After Visit Summary given.    Procedures:      Elvina SidleLauenstein, Ayslin Kundert, MD 07/11/17 1225

## 2018-03-12 ENCOUNTER — Ambulatory Visit (HOSPITAL_COMMUNITY)
Admission: EM | Admit: 2018-03-12 | Discharge: 2018-03-12 | Disposition: A | Discharge status: Home / Self Care | Payer: Self-pay | Attending: Internal Medicine | Admitting: Internal Medicine

## 2018-03-12 ENCOUNTER — Encounter (HOSPITAL_COMMUNITY): Payer: Self-pay | Admitting: Emergency Medicine

## 2018-03-12 DIAGNOSIS — J45909 Unspecified asthma, uncomplicated: Secondary | ICD-10-CM | POA: Insufficient documentation

## 2018-03-12 DIAGNOSIS — Z87442 Personal history of urinary calculi: Secondary | ICD-10-CM | POA: Insufficient documentation

## 2018-03-12 DIAGNOSIS — B9789 Other viral agents as the cause of diseases classified elsewhere: Secondary | ICD-10-CM

## 2018-03-12 DIAGNOSIS — F17200 Nicotine dependence, unspecified, uncomplicated: Secondary | ICD-10-CM | POA: Insufficient documentation

## 2018-03-12 DIAGNOSIS — Z79899 Other long term (current) drug therapy: Secondary | ICD-10-CM | POA: Insufficient documentation

## 2018-03-12 DIAGNOSIS — J029 Acute pharyngitis, unspecified: Secondary | ICD-10-CM | POA: Insufficient documentation

## 2018-03-12 DIAGNOSIS — J069 Acute upper respiratory infection, unspecified: Secondary | ICD-10-CM

## 2018-03-12 LAB — POCT RAPID STREP A: STREPTOCOCCUS, GROUP A SCREEN (DIRECT): NEGATIVE

## 2018-03-12 MED ORDER — CETIRIZINE HCL 5 MG PO TABS: 5.0000 mg | ORAL_TABLET | Freq: Every day | ORAL | 1 refills | Status: AC

## 2018-03-12 MED ORDER — BENZONATATE 100 MG PO CAPS: 100.0000 mg | ORAL_CAPSULE | Freq: Three times a day (TID) | ORAL | 0 refills | Status: DC

## 2018-03-12 NOTE — ED Triage Notes (Signed)
T has been in contact with people who have strep. Sore throat and chills

## 2018-03-12 NOTE — Discharge Instructions (Addendum)
°  It was nice meeting you!!  °I believe you have a viral upper respiratory infection. °It could take 7 to 10 days for the symptoms to decrease or improve.  °We will treat your cough with Tessalon Perles. °Zyrtec and mucinex for the drainage in your throat and congestion.  °Ibuprofen and tylenol can help with the pain.  °Chloraseptic throat spray or lozenges are another option for symptoms relief.  °Continue the warm tea if that helps.  °Follow up as needed for worsening symptoms. ' °

## 2018-03-12 NOTE — ED Provider Notes (Signed)
[facility]    CSN: [id number] Arrival date & time: 03/12/18  1119     History   Chief Complaint Chief Complaint  Patient presents with  . Sore Throat    HPI Jeanne Huynh is a 52 y.o. female.   The history is provided by the patient.  Sore Throat  This is a new problem. The current episode started more than 2 days ago. The problem occurs constantly. The problem has been gradually worsening. Associated symptoms include headaches. Pertinent negatives include no chest pain, no abdominal pain and no shortness of breath. The symptoms are aggravated by coughing, eating and swallowing. Relieved by: theraflu. The treatment provided mild relief.    Patient is concerned for strep throat.  She reports that her boyfriend and daughter were both positive.  She denies any history of allergies. She has been having associated fever, chills, body aches, weakness.  She has also had a dry cough and sinus pressure.   Patient has medical history of asthma.  She has been using her inhaler.  She is a current everyday smoker.  ROS per HPI  Past Medical History:  Diagnosis Date  . Asthma   . Enlarged kidney   . Kidney stone     Patient Active Problem List   Diagnosis Date Noted  . Pyelonephritis 06/25/2013    History reviewed. No pertinent surgical history.  OB History   None      Home Medications    Prior to Admission medications   Medication Sig Start Date End Date Taking? Authorizing Provider  ALPRAZolam Prudy Feeler(XANAX) 1 MG tablet Take 1 mg by mouth 4 (four) times daily as needed for anxiety.    Yes [provider]  citalopram (CELEXA) 20 MG tablet Take 20 mg by mouth at bedtime.   Yes [provider]  albuterol (PROVENTIL HFA;VENTOLIN HFA) 108 (90 Base) MCG/ACT inhaler Inhale 2 puffs into the lungs every 6 (six) hours as needed for wheezing. 04/17/16   Alene Miresmohundro, Jennifer C, NP  doxycycline (VIBRA-TABS) 100 MG tablet Take 1 tablet (100 mg total) by mouth  2 (two) times daily. 07/11/17   Elvina SidleLauenstein, Kurt, MD  traMADol (ULTRAM) 50 MG tablet Take 1 tablet (50 mg total) by mouth every 6 (six) hours as needed. 07/11/17   Elvina SidleLauenstein, Kurt, MD    Family History Family History  Problem Relation Age of Onset  . Cancer Mother   . Heart failure Father     Social History Social History   Tobacco Use  . Smoking status: Current Every Day Smoker  . Smokeless tobacco: Never Used  Substance Use Topics  . Alcohol use: Yes  . Drug use: Not on file     Allergies   Aspirin; Codeine; Motrin [ibuprofen]; and Penicillins   Review of Systems Review of Systems  Respiratory: Negative for shortness of breath.   Cardiovascular: Negative for chest pain.  Gastrointestinal: Negative for abdominal pain.  Neurological: Positive for headaches.     Physical Exam Triage Vital Signs ED Triage Vitals  Enc Vitals Group     BP 03/12/18 1147 (!) 159/96     Pulse Rate 03/12/18 1146 83     Resp 03/12/18 1146 16     Temp 03/12/18 1146 98.2 F (36.8 C)     Temp Source 03/12/18 1146 Oral     SpO2 03/12/18 1146 100 %     Weight 03/12/18 1145 118 lb (53.5 kg)     Height --  Head Circumference --      Peak Flow --      Pain Score 03/12/18 1145 5     Pain Loc --      Pain Edu? --      Excl. in GC? --    No data found.  Updated Vital Signs BP (!) 159/96   Pulse 83   Temp 98.2 F (36.8 C) (Oral)   Resp 16   Wt 118 lb (53.5 kg)   LMP 05/04/2013   SpO2 100%   BMI 19.05 kg/m   Visual Acuity Right Eye Distance:   Left Eye Distance:   Bilateral Distance:    Right Eye Near:   Left Eye Near:    Bilateral Near:     Physical Exam  Constitutional: She appears ill.  HENT:  Head: Normocephalic and atraumatic.  Right Ear: Hearing, tympanic membrane and ear canal normal. No drainage, swelling or tenderness. No middle ear effusion.  Left Ear: Hearing, tympanic membrane and ear canal normal. No drainage, swelling or tenderness.  No middle ear  effusion.  Mouth/Throat: Uvula is midline. Mucous membranes are dry. No oral lesions. Uvula swelling present. Posterior oropharyngeal erythema present. No oropharyngeal exudate, posterior oropharyngeal edema or tonsillar abscesses. Tonsils are 0 on the right. Tonsils are 0 on the left. No tonsillar exudate.  Eyes: Pupils are equal, round, and reactive to light.  Cardiovascular: Normal rate, regular rhythm and normal heart sounds.  Pulmonary/Chest: Effort normal and breath sounds normal.  Lymphadenopathy:    She has no cervical adenopathy.  Neurological: She is alert.  Skin: Skin is warm and dry. Capillary refill takes less than 2 seconds.  Psychiatric: She has a normal mood and affect.  Nursing note and vitals reviewed.    UC Treatments / Results  Labs (all labs ordered are listed, but only abnormal results are displayed) Labs Reviewed - No data to display  EKG None  Radiology No results found.  Procedures Procedures (including critical care time)  Medications Ordered in UC Medications - No data to display  Initial Impression / Assessment and Plan / UC Course  I have reviewed the triage vital signs and the nursing notes.  Pertinent labs & imaging results that were available during my care of the patient were reviewed by me and considered in my medical decision making (see chart for details).     We will obtain a rapid strep test in clinic based on exposure history.  Will send for culture if negative  Strep test negative.  Most likely viral URI/pharyngitis.  Symptomatic treatment.  Need for antibiotics at this time.  Return for worsening symptoms. Final Clinical Impressions(s) / UC Diagnoses   Final diagnoses:  None   Discharge Instructions   None    ED Prescriptions    None     Controlled Substance Prescriptions [place] Controlled Substance Registry consulted? Not Applicable   Janace Aris, NP 03/12/18 1304

## 2018-03-14 LAB — CULTURE, GROUP A STREP (THRC)

## 2018-10-28 ENCOUNTER — Other Ambulatory Visit: Payer: Self-pay

## 2018-10-28 ENCOUNTER — Ambulatory Visit (INDEPENDENT_AMBULATORY_CARE_PROVIDER_SITE_OTHER): Payer: Self-pay

## 2018-10-28 ENCOUNTER — Encounter (HOSPITAL_COMMUNITY): Payer: Self-pay

## 2018-10-28 ENCOUNTER — Ambulatory Visit (HOSPITAL_COMMUNITY)
Admission: EM | Admit: 2018-10-28 | Discharge: 2018-10-28 | Disposition: A | Discharge status: Home / Self Care | Payer: Self-pay | Attending: Internal Medicine | Admitting: Internal Medicine

## 2018-10-28 DIAGNOSIS — R05 Cough: Secondary | ICD-10-CM

## 2018-10-28 DIAGNOSIS — M7918 Myalgia, other site: Secondary | ICD-10-CM

## 2018-10-28 DIAGNOSIS — J029 Acute pharyngitis, unspecified: Secondary | ICD-10-CM

## 2018-10-28 DIAGNOSIS — R6889 Other general symptoms and signs: Secondary | ICD-10-CM

## 2018-10-28 DIAGNOSIS — R5383 Other fatigue: Secondary | ICD-10-CM

## 2018-10-28 DIAGNOSIS — R509 Fever, unspecified: Secondary | ICD-10-CM

## 2018-10-28 DIAGNOSIS — J40 Bronchitis, not specified as acute or chronic: Secondary | ICD-10-CM

## 2018-10-28 MED ORDER — IPRATROPIUM-ALBUTEROL 0.5-2.5 (3) MG/3ML IN SOLN
RESPIRATORY_TRACT | Status: AC
  Filled 2018-10-28: qty 3

## 2018-10-28 MED ORDER — ALBUTEROL SULFATE HFA 108 (90 BASE) MCG/ACT IN AERS: 2.0000 | INHALATION_SPRAY | Freq: Four times a day (QID) | RESPIRATORY_TRACT | 0 refills | Status: DC | PRN

## 2018-10-28 MED ORDER — OSELTAMIVIR PHOSPHATE 75 MG PO CAPS: 75.0000 mg | ORAL_CAPSULE | Freq: Two times a day (BID) | ORAL | 0 refills | Status: DC

## 2018-10-28 MED ORDER — IPRATROPIUM-ALBUTEROL 0.5-2.5 (3) MG/3ML IN SOLN
3.0000 mL | Freq: Once | RESPIRATORY_TRACT | Status: AC
  Administered 2018-10-28: 3 mL via RESPIRATORY_TRACT

## 2018-10-28 MED ORDER — AZITHROMYCIN 250 MG PO TABS: ORAL_TABLET | ORAL | 0 refills | Status: DC

## 2018-10-28 NOTE — ED Triage Notes (Signed)
Pt presents with chest pain, non productive cough, congestion, nasal drainage, chills, and general body aches X 4 days.

## 2018-10-28 NOTE — ED Provider Notes (Signed)
[facility]    CSN: [id number] Arrival date & time: 10/28/18  1253     History   Chief Complaint Chief Complaint  Patient presents with  . Cough  . Chest Pain  . Chills  . Nasal Drainage  . Congestion    HPI Jeanne Huynh is a 53 y.o. female.   Onset of sneezing, rhinitis, ST and cough 4 days ago. Yesterday she got worse with body aches and fever up to 100.5. 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 Cough is productive, but only comes up on occasion. She works with the public, but her husband and coworkers have been well.      Past Medical History:  Diagnosis Date  . Asthma   . Enlarged kidney   . Kidney stone     Patient Active Problem List   Diagnosis Date Noted  . Pyelonephritis 06/25/2013    History reviewed. No pertinent surgical history.  OB History   No obstetric history on file.      Home Medications    Prior to Admission medications   Medication Sig Start Date End Date Taking? Authorizing Provider  albuterol (PROVENTIL HFA;VENTOLIN HFA) 108 (90 Base) MCG/ACT inhaler Inhale 2 puffs into the lungs every 6 (six) hours as needed for wheezing. 10/28/18   Rodriguez-Southworth, Nettie Elm, PA-C  ALPRAZolam Prudy Feeler) 1 MG tablet Take 1 mg by mouth 4 (four) times daily as needed for anxiety.     [provider]  azithromycin (ZITHROMAX Z-PAK) 250 MG tablet 2 today, then one qd x 4 days 10/28/18   Rodriguez-Southworth, Nettie Elm, PA-C  cetirizine (ZYRTEC) 5 MG tablet Take 1 tablet (5 mg total) by mouth daily. 03/12/18   Dahlia Byes A, NP  citalopram (CELEXA) 20 MG tablet Take 20 mg by mouth at bedtime.    [provider]  oseltamivir (TAMIFLU) 75 MG capsule Take 1 capsule (75 mg total) by mouth every 12 (twelve) hours. 10/28/18   Rodriguez-Southworth, Nettie Elm, PA-C  traMADol (ULTRAM) 50 MG tablet Take 1 tablet (50 mg total) by mouth every 6 (six) hours as needed. 07/11/17    Elvina Sidle, MD    Family History Family History  Problem Relation Age of Onset  . Cancer Mother   . Heart failure Father     Social History Social History   Tobacco Use  . Smoking status: Current Every Day Smoker  . Smokeless tobacco: Never Used  Substance Use Topics  . Alcohol use: Yes  . Drug use: Not on file     Allergies   Aspirin; Codeine; Motrin [ibuprofen]; and Penicillins   Review of Systems Review of Systems  Constitutional: Positive for chills, diaphoresis and fever. Negative for appetite change.  HENT: Positive for congestion, postnasal drip, rhinorrhea and sneezing. Negative for ear discharge, ear pain, sore throat and trouble swallowing.   Eyes: Positive for itching. Negative for discharge.  Respiratory: Positive for cough, shortness of breath and wheezing. Negative for chest tightness.   Cardiovascular: Negative for chest pain.  Gastrointestinal: Negative for diarrhea, nausea and vomiting.  Genitourinary: Negative for difficulty urinating.  Musculoskeletal: Positive for myalgias.  Skin: Negative for rash.  Neurological: Positive for headaches.  Hematological: Negative for adenopathy.    Physical Exam Triage Vital Signs ED Triage Vitals  Enc Vitals Group     BP 10/28/18 1408 125/77     Pulse Rate 10/28/18 1408 100     Resp 10/28/18  1408 17     Temp 10/28/18 1408 98.5 F (36.9 C)     Temp Source 10/28/18 1408 Oral     SpO2 10/28/18 1408 95 %     Weight --      Height --      Head Circumference --      Peak Flow --      Pain Score 10/28/18 1411 8     Pain Loc --      Pain Edu? --      Excl. in GC? --    No data found.  Updated Vital Signs BP 125/77 (BP Location: Right Arm)   Pulse 100   Temp 98.5 F (36.9 C) (Oral)   Resp 17   LMP 05/04/2013   SpO2 95%   Visual Acuity Right Eye Distance:   Left Eye Distance:   Bilateral Distance:    Right Eye Near:   Left Eye Near:    Bilateral Near:     Physical Exam Vitals signs and  nursing note reviewed.  Constitutional:      General: She is not in acute distress.    Appearance: She is ill-appearing. She is not toxic-appearing or diaphoretic.  Neck:     Musculoskeletal: Normal range of motion and neck supple.  Cardiovascular:     Rate and Rhythm: Normal rate and regular rhythm.     Heart sounds: Normal heart sounds. No murmur.  Pulmonary:     Effort: Pulmonary effort is normal.     Breath sounds: Normal breath sounds. No wheezing, rhonchi or rales.  Chest:     Chest wall: No tenderness.  Musculoskeletal: Normal range of motion.  Lymphadenopathy:     Cervical: No cervical adenopathy.  Skin:    General: Skin is warm and dry.     Findings: No rash.  Neurological:     Mental Status: She is alert and oriented to person, place, and time.  Psychiatric:        Mood and Affect: Mood normal.        Behavior: Behavior normal.   Post duo neb pulse ox 97%, lungs remained clear.   UC Treatments / Results  Labs (all labs ordered are listed, but only abnormal results are displayed) Labs Reviewed - No data to display  EKG None  Radiology Dg Chest 2 View  Result Date: 10/28/2018 CLINICAL DATA:  Cough and fatigue EXAM: CHEST - 2 VIEW COMPARISON:  04/19/2016 FINDINGS: The heart size and mediastinal contours are within normal limits. Both lungs are clear. The visualized skeletal structures are unremarkable. IMPRESSION: No active cardiopulmonary disease. Electronically Signed   By: Elige Ko   On: 10/28/2018 15:01    Procedures Procedures   Medications Ordered in UC Medications  ipratropium-albuterol (DUONEB) 0.5-2.5 (3) MG/3ML nebulizer solution 3 mL (3 mLs Nebulization Given 10/28/18 1440)    Initial Impression / Assessment and Plan / UC Course  I have reviewed the triage vital signs and the nursing notes.  Pertinent  imaging results that were available during my care of the patient were reviewed by me and considered in my medical decision making (see chart  for details).  I suspect she has the flu and her asthma is flairring up. I placed her on Tamiflu and Zpack since she is a smoker at prone to secondary infections. No work til Thursday. I also refilled her Proventil inhaler.   Final Clinical Impressions(s) / UC Diagnoses   Final diagnoses:  Bronchitis  Flu-like symptoms  Discharge Instructions     I suspect you have the flu and is making your asthma act up. I am placing you on medication for the flu and bronchitis so you dont end up with pneumonia.     ED Prescriptions    Medication Sig Dispense Auth. Provider   albuterol (PROVENTIL HFA;VENTOLIN HFA) 108 (90 Base) MCG/ACT inhaler  (Status: Discontinued) Inhale 2 puffs into the lungs every 6 (six) hours as needed for wheezing. 1 Inhaler Rodriguez-Southworth, Nettie Elm, PA-C   oseltamivir (TAMIFLU) 75 MG capsule Take 1 capsule (75 mg total) by mouth every 12 (twelve) hours. 10 capsule Rodriguez-Southworth, Nettie Elm, PA-C   azithromycin (ZITHROMAX Z-PAK) 250 MG tablet 2 today, then one qd x 4 days 6 tablet Rodriguez-Southworth, Nettie Elm, PA-C   albuterol (PROVENTIL HFA;VENTOLIN HFA) 108 (90 Base) MCG/ACT inhaler Inhale 2 puffs into the lungs every 6 (six) hours as needed for wheezing. 1 Inhaler Rodriguez-Southworth, Nettie Elm, PA-C     Controlled Substance Prescriptions  Controlled Substance Registry consulted?    Garey Ham, PA-C 10/28/18 1521

## 2018-10-28 NOTE — Discharge Instructions (Signed)
I suspect you have the flu and is making your asthma act up. I am placing you on medication for the flu and bronchitis so you dont end up with pneumonia.

## 2019-03-06 ENCOUNTER — Ambulatory Visit (HOSPITAL_COMMUNITY)
Admission: EM | Admit: 2019-03-06 | Discharge: 2019-03-06 | Disposition: A | Discharge status: Home / Self Care | Payer: Self-pay | Attending: Family Medicine | Admitting: Family Medicine

## 2019-03-06 ENCOUNTER — Other Ambulatory Visit: Payer: Self-pay

## 2019-03-06 ENCOUNTER — Encounter (HOSPITAL_COMMUNITY): Payer: Self-pay

## 2019-03-06 DIAGNOSIS — M5441 Lumbago with sciatica, right side: Secondary | ICD-10-CM

## 2019-03-06 DIAGNOSIS — M5442 Lumbago with sciatica, left side: Secondary | ICD-10-CM

## 2019-03-06 MED ORDER — TRAMADOL HCL 50 MG PO TABS: 50.0000 mg | ORAL_TABLET | Freq: Four times a day (QID) | ORAL | 0 refills | Status: AC | PRN

## 2019-03-06 MED ORDER — METHOCARBAMOL 500 MG PO TABS: 500.0000 mg | ORAL_TABLET | Freq: Two times a day (BID) | ORAL | 0 refills | Status: DC

## 2019-03-06 MED ORDER — PREDNISONE 10 MG (21) PO TBPK: ORAL_TABLET | ORAL | 0 refills | Status: DC
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
## 2019-03-06 NOTE — ED Triage Notes (Signed)
Pt states she has lower back pain it's radiating down onto her left leg. This started this past Sunday.

## 2019-03-06 NOTE — Discharge Instructions (Signed)
Treating you for sciatic nerve pain. Prednisone taper over the next 6 days for nerve pain inflammation. Tramadol for more severe pain to use as needed Flexeril for muscle relaxant If symptoms are not improving or worsening over the next 3 to 4 days you will need to follow-up for recheck.

## 2019-04-30 ENCOUNTER — Encounter (HOSPITAL_COMMUNITY): Payer: Self-pay

## 2019-04-30 ENCOUNTER — Ambulatory Visit (HOSPITAL_COMMUNITY)
Admission: EM | Admit: 2019-04-30 | Discharge: 2019-04-30 | Disposition: A | Discharge status: Home / Self Care | Payer: HRSA Program | Attending: Family Medicine | Admitting: Family Medicine

## 2019-04-30 ENCOUNTER — Other Ambulatory Visit: Payer: Self-pay

## 2019-04-30 DIAGNOSIS — Z79899 Other long term (current) drug therapy: Secondary | ICD-10-CM | POA: Insufficient documentation

## 2019-04-30 DIAGNOSIS — J029 Acute pharyngitis, unspecified: Secondary | ICD-10-CM | POA: Diagnosis not present

## 2019-04-30 DIAGNOSIS — R6889 Other general symptoms and signs: Secondary | ICD-10-CM | POA: Insufficient documentation

## 2019-04-30 DIAGNOSIS — Z20822 Contact with and (suspected) exposure to covid-19: Secondary | ICD-10-CM

## 2019-04-30 DIAGNOSIS — F17200 Nicotine dependence, unspecified, uncomplicated: Secondary | ICD-10-CM | POA: Insufficient documentation

## 2019-04-30 DIAGNOSIS — Z886 Allergy status to analgesic agent status: Secondary | ICD-10-CM | POA: Insufficient documentation

## 2019-04-30 DIAGNOSIS — Z885 Allergy status to narcotic agent status: Secondary | ICD-10-CM | POA: Insufficient documentation

## 2019-04-30 DIAGNOSIS — Z880 Allergy status to penicillin: Secondary | ICD-10-CM | POA: Insufficient documentation

## 2019-04-30 DIAGNOSIS — Z1159 Encounter for screening for other viral diseases: Secondary | ICD-10-CM

## 2019-04-30 DIAGNOSIS — Z20828 Contact with and (suspected) exposure to other viral communicable diseases: Secondary | ICD-10-CM | POA: Insufficient documentation

## 2019-04-30 LAB — POCT RAPID STREP A: Streptococcus, Group A Screen (Direct): NEGATIVE

## 2019-04-30 NOTE — ED Triage Notes (Signed)
Patient reports she is having headache and cough for 1 week, she started having diarrhea,sore throat and chills on Monday.

## 2019-04-30 NOTE — Discharge Instructions (Addendum)
Your rapid strep test is negative.  A throat culture is pending; we will call you if it is positive requiring treatment.    Your COVID test is pending.  You should self quarantine until your test result is back and is negative.    Go to the emergency department if you develop high fever, shortness of breath, severe diarrhea, or other concerning symptoms.    

## 2019-04-30 NOTE — ED Provider Notes (Signed)
[facility]    CSN: [id number] Arrival date & time: 04/30/19  1355      History   Chief Complaint Chief Complaint  Patient presents with  . Headache  . Cough  . Sore Throat  . Chills  . Diarrhea    HPI Jeanne Huynh is a 53 y.o. female.   Patient presents with nonproductive cough, headache x1 week.  She also reports 2-day history of diarrhea, sore throat, chills.  Patient states she needs a COVID test before she can return to work.  She denies fever, shortness of breath, abdominal pain, vomiting, rash, or other symptoms.  LMP: Postmenopausal.  The history is provided by the patient.    Past Medical History:  Diagnosis Date  . Asthma   . Enlarged kidney   . Kidney stone     Patient Active Problem List   Diagnosis Date Noted  . Pyelonephritis 06/25/2013    No past surgical history on file.  OB History   No obstetric history on file.      Home Medications    Prior to Admission medications   Medication Sig Start Date End Date Taking? Authorizing Provider  albuterol (PROVENTIL HFA;VENTOLIN HFA) 108 (90 Base) MCG/ACT inhaler Inhale 2 puffs into the lungs every 6 (six) hours as needed for wheezing. 10/28/18   Rodriguez-Southworth, Nettie ElmSylvia, PA-C  ALPRAZolam Prudy Feeler(XANAX) 1 MG tablet Take 1 mg by mouth 4 (four) times daily as needed for anxiety.     [provider]  cetirizine (ZYRTEC) 5 MG tablet Take 1 tablet (5 mg total) by mouth daily. 03/12/18   Dahlia ByesBast, Traci A, NP  citalopram (CELEXA) 20 MG tablet Take 20 mg by mouth at bedtime.    [provider]  methocarbamol (ROBAXIN) 500 MG tablet Take 1 tablet (500 mg total) by mouth 2 (two) times daily. 03/06/19   Dahlia ByesBast, Traci A, NP  predniSONE (STERAPRED UNI-PAK 21 TAB) 10 MG (21) TBPK tablet 6 tabs for 1 day, then 5 tabs for 1 das, then 4 tabs for 1 day, then 3 tabs for 1 day, 2 tabs for 1 day, then 1 tab for 1 day 03/06/19   Janace ArisBast, Traci A, NP    Family History Family History  Problem Relation Age of  Onset  . Cancer Mother   . Heart failure Father     Social History Social History   Tobacco Use  . Smoking status: Current Every Day Smoker  . Smokeless tobacco: Never Used  Substance Use Topics  . Alcohol use: Yes  . Drug use: Not on file     Allergies   Aspirin, Codeine, Motrin [ibuprofen], and Penicillins   Review of Systems Review of Systems  Constitutional: Positive for chills. Negative for fever.  HENT: Positive for sore throat. Negative for ear pain.   Eyes: Negative for pain and visual disturbance.  Respiratory: Positive for cough. Negative for shortness of breath.   Cardiovascular: Negative for chest pain and palpitations.  Gastrointestinal: Positive for diarrhea. Negative for abdominal pain and vomiting.  Genitourinary: Negative for dysuria and hematuria.  Musculoskeletal: Negative for arthralgias and back pain.  Skin: Negative for color change and rash.  Neurological: Positive for headaches. Negative for seizures and syncope.  All other systems reviewed and are negative.    Physical Exam Triage Vital Signs ED Triage Vitals  Enc Vitals Group     BP 04/30/19 1419 135/85     Pulse Rate 04/30/19 1419 94     Resp 04/30/19 1419  16     Temp 04/30/19 1419 98.1 F (36.7 C)     Temp Source 04/30/19 1419 Temporal     SpO2 04/30/19 1419 100 %     Weight --      Height --      Head Circumference --      Peak Flow --      Pain Score 04/30/19 1416 5     Pain Loc --      Pain Edu? --      Excl. in GC? --    No data found.  Updated Vital Signs BP 135/85 (BP Location: Left Arm)   Pulse 94   Temp 98.1 F (36.7 C) (Temporal)   Resp 16   LMP 05/04/2013   SpO2 100%   Visual Acuity Right Eye Distance:   Left Eye Distance:   Bilateral Distance:    Right Eye Near:   Left Eye Near:    Bilateral Near:     Physical Exam Vitals signs and nursing note reviewed.  Constitutional:      General: She is not in acute distress.    Appearance: She is  well-developed.  HENT:     Head: Normocephalic and atraumatic.     Right Ear: Tympanic membrane normal.     Left Ear: Tympanic membrane normal.     Nose: Nose normal.     Mouth/Throat:     Mouth: Mucous membranes are moist.     Pharynx: Posterior oropharyngeal erythema present. No oropharyngeal exudate.  Eyes:     Extraocular Movements: Extraocular movements intact.     Conjunctiva/sclera: Conjunctivae normal.     Pupils: Pupils are equal, round, and reactive to light.  Neck:     Musculoskeletal: Neck supple.  Cardiovascular:     Rate and Rhythm: Normal rate and regular rhythm.     Heart sounds: No murmur.  Pulmonary:     Effort: Pulmonary effort is normal. No respiratory distress.     Breath sounds: Normal breath sounds.  Abdominal:     General: Bowel sounds are normal.     Palpations: Abdomen is soft.     Tenderness: There is no abdominal tenderness. There is no guarding or rebound.  Skin:    General: Skin is warm and dry.     Findings: No rash.  Neurological:     General: No focal deficit present.     Mental Status: She is alert and oriented to person, place, and time.     Cranial Nerves: No cranial nerve deficit.     Sensory: No sensory deficit.     Motor: No weakness.     Coordination: Coordination normal.     Gait: Gait normal.     Deep Tendon Reflexes: Reflexes normal.  Psychiatric:        Mood and Affect: Mood normal.        Behavior: Behavior normal.      UC Treatments / Results  Labs (all labs ordered are listed, but only abnormal results are displayed) Labs Reviewed  NOVEL CORONAVIRUS, NAA (HOSP ORDER, SEND-OUT TO REF LAB; TAT 18-24 HRS)  POCT RAPID STREP A    EKG   Radiology No results found.  Procedures Procedures (including critical care time)  Medications Ordered in UC Medications - No data to display  Initial Impression / Assessment and Plan / UC Course  I have reviewed the triage vital signs and the nursing notes.  Pertinent labs &  imaging results that were available during  my care of the patient were reviewed by me and considered in my medical decision making (see chart for details).    Sore throat, suspected COVID.  Rapid strep negative; culture pending.  COVID test performed here.  Instructed patient to self quarantine until her test results are back.  Instructed patient to go to the emergency department if she develops high fever, shortness of breath, severe diarrhea, or other concerning symptoms.  Patient agrees with plan of care.    Final Clinical Impressions(s) / UC Diagnoses   Final diagnoses:  Sore throat  Suspected Covid-19 Virus Infection     Discharge Instructions     Your rapid strep test is negative.  A throat culture is pending; we will call you if it is positive requiring treatment.    Your COVID test is pending.  You should self quarantine until your test result is back and is negative.    Go to the emergency department if you develop high fever, shortness of breath, severe diarrhea, or other concerning symptoms.       ED Prescriptions    None     Controlled Substance Prescriptions [place] Controlled Substance Registry consulted? Not Applicable   Sharion Balloon, NP 04/30/19 1448

## 2019-05-01 LAB — NOVEL CORONAVIRUS, NAA (HOSP ORDER, SEND-OUT TO REF LAB; TAT 18-24 HRS): SARS-CoV-2, NAA: NOT DETECTED

## 2019-05-03 LAB — CULTURE, GROUP A STREP (THRC)

## 2019-05-05 ENCOUNTER — Telehealth: Payer: Self-pay | Admitting: General Practice

## 2019-05-05 NOTE — Telephone Encounter (Signed)
Negative COVID results given. Patient results "NOT Detected." Caller expressed understanding. ° °

## 2019-12-30 ENCOUNTER — Other Ambulatory Visit: Payer: Self-pay | Admitting: Internal Medicine

## 2020-03-05 ENCOUNTER — Ambulatory Visit (HOSPITAL_COMMUNITY)
Admission: EM | Admit: 2020-03-05 | Discharge: 2020-03-05 | Disposition: A | Discharge status: Home / Self Care | Payer: Self-pay | Attending: Family Medicine | Admitting: Family Medicine

## 2020-03-05 ENCOUNTER — Other Ambulatory Visit: Payer: Self-pay

## 2020-03-05 ENCOUNTER — Encounter (HOSPITAL_COMMUNITY): Payer: Self-pay

## 2020-03-05 DIAGNOSIS — S0083XA Contusion of other part of head, initial encounter: Secondary | ICD-10-CM

## 2020-03-05 DIAGNOSIS — W19XXXA Unspecified fall, initial encounter: Secondary | ICD-10-CM

## 2020-03-05 DIAGNOSIS — S63502A Unspecified sprain of left wrist, initial encounter: Secondary | ICD-10-CM

## 2020-03-05 MED ORDER — TRAMADOL HCL 50 MG PO TABS: 50.0000 mg | ORAL_TABLET | Freq: Four times a day (QID) | ORAL | 0 refills | Status: DC | PRN

## 2020-03-05 MED ORDER — CYCLOBENZAPRINE HCL 5 MG PO TABS: 5.0000 mg | ORAL_TABLET | Freq: Two times a day (BID) | ORAL | 0 refills | Status: DC | PRN

## 2020-03-05 NOTE — ED Provider Notes (Signed)
[facility]    CSN: [id number] Arrival date & time: 03/05/20  1056      History   Chief Complaint Chief Complaint  Patient presents with  . Fall    HPI Jeanne Huynh is a 54 y.o. female history of tobacco use presenting today for evaluation of left arm pain and facial pain/swelling after fall.  Patient was leaving a restaurant, did not notice the end of the sidewalk and fell forward.  She landed on her left side of her face and left hand.  Denies loss of consciousness.  Denies blood thinners.  She denies any difficulty bending her joints of her arm/wrist/hand, but does report increased soreness over the past couple of days.  Does report some occasional blurry vision yesterday, but denies today.  Headache around left eye/jaw.  Accident happened 2 days ago.  HPI  Past Medical History:  Diagnosis Date  . Asthma   . Enlarged kidney   . Kidney stone     Patient Active Problem List   Diagnosis Date Noted  . Pyelonephritis 06/25/2013    History reviewed. No pertinent surgical history.  OB History   No obstetric history on file.      Home Medications    Prior to Admission medications   Medication Sig Start Date End Date Taking? Authorizing Provider  albuterol (PROVENTIL HFA;VENTOLIN HFA) 108 (90 Base) MCG/ACT inhaler Inhale 2 puffs into the lungs every 6 (six) hours as needed for wheezing. 10/28/18   Rodriguez-Southworth, Nettie Elm, PA-C  ALPRAZolam Prudy Feeler) 1 MG tablet Take 1 mg by mouth 4 (four) times daily as needed for anxiety.     [provider]  cetirizine (ZYRTEC) 5 MG tablet Take 1 tablet (5 mg total) by mouth daily. 03/12/18   Dahlia Byes A, NP  citalopram (CELEXA) 20 MG tablet Take 20 mg by mouth at bedtime.    [provider]  cyclobenzaprine (FLEXERIL) 5 MG tablet Take 1-2 tablets (5-10 mg total) by mouth 2 (two) times daily as needed for muscle spasms. 03/05/20   Curren Mohrmann C, PA-C  traMADol (ULTRAM) 50 MG tablet Take 1 tablet (50  mg total) by mouth every 6 (six) hours as needed. 03/05/20   Awilda Covin, Junius Creamer, PA-C    Family History Family History  Problem Relation Age of Onset  . Cancer Mother   . Heart failure Father     Social History Social History   Tobacco Use  . Smoking status: Current Every Day Smoker  . Smokeless tobacco: Never Used  Substance Use Topics  . Alcohol use: Yes  . Drug use: Not on file     Allergies   Aspirin, Codeine, Motrin [ibuprofen], and Penicillins   Review of Systems Review of Systems  Constitutional: Negative for activity change, chills, diaphoresis and fatigue.  HENT: Positive for facial swelling. Negative for ear pain, tinnitus and trouble swallowing.   Eyes: Negative for photophobia and visual disturbance.  Respiratory: Negative for cough, chest tightness and shortness of breath.   Cardiovascular: Negative for chest pain and leg swelling.  Gastrointestinal: Negative for abdominal pain, blood in stool, nausea and vomiting.  Musculoskeletal: Positive for arthralgias and myalgias. Negative for back pain, gait problem, neck pain and neck stiffness.  Skin: Positive for color change. Negative for wound.  Neurological: Negative for dizziness, weakness, light-headedness, numbness and headaches.     Physical Exam Triage Vital Signs ED Triage Vitals  Enc Vitals Group     BP      Pulse  Resp      Temp      Temp src      SpO2      Weight      Height      Head Circumference      Peak Flow      Pain Score      Pain Loc      Pain Edu?      Excl. in GC?    No data found.  Updated Vital Signs BP (!) 168/100   Pulse 90   Temp 98 F (36.7 C)   Resp 18   LMP 05/04/2013   SpO2 99%   Visual Acuity Right Eye Distance:   Left Eye Distance:   Bilateral Distance:    Right Eye Near:   Left Eye Near:    Bilateral Near:     Physical Exam Vitals and nursing note reviewed.  Constitutional:      Appearance: She is well-developed.     Comments: No acute  distress  HENT:     Head: Normocephalic.     Comments: Mild swelling and bruising noted about left periorbital area, nontender to palpation along bridge of nose, increased tenderness extending over superior orbit towards lateral aspect lateral zygomatic area, full active range of motion at TMJ, no crepitus palpated with facial bones    Right Ear: Tympanic membrane normal.     Left Ear: Tympanic membrane normal.     Ears:     Comments: No hemotympanum    Nose: Nose normal.     Mouth/Throat:     Comments: Oral mucosa pink and moist, no tonsillar enlargement or exudate. Posterior pharynx patent and nonerythematous, no uvula deviation or swelling. Normal phonation. palate elevate symmetrically Eyes:     Extraocular Movements: Extraocular movements intact.     Conjunctiva/sclera: Conjunctivae normal.     Pupils: Pupils are equal, round, and reactive to light.     Comments: No hyphema, no erythema  Cardiovascular:     Rate and Rhythm: Normal rate.  Pulmonary:     Effort: Pulmonary effort is normal. No respiratory distress.  Abdominal:     General: There is no distension.  Musculoskeletal:        General: Normal range of motion.     Cervical back: Neck supple.     Comments: Left hand/wrist: Mild abrasion and bruising noted to palmar surface of thenar eminence, nontender throughout palpation of dorsum of hand overall metacarpals, full active range of motion of all fingers at PIP and DIP, mild tenderness to palpation of distal radius, no overlying swelling or discoloration or deformity, full active range of motion of wrist  Left elbow: Full active range of motion of the elbow, mild tenderness to palpation antecubital area, nontender to palpation over medial lateral epicondyle/olecranon process  Left shoulder: Nontender to palpation along clavicle, AC joint or scapular spine, mild tenderness to superior thoracic/trapezius musculature, full active range of motion of shoulder  Nontender to  palpation along cervical, thoracic and lumbar spine midline   Skin:    General: Skin is warm and dry.  Neurological:     General: No focal deficit present.     Mental Status: She is alert and oriented to person, place, and time. Mental status is at baseline.     Cranial Nerves: No cranial nerve deficit.     Motor: No weakness.     Gait: Gait normal.      UC Treatments / Results  Labs (all labs ordered  are listed, but only abnormal results are displayed) Labs Reviewed - No data to display  EKG   Radiology No results found.  Procedures Procedures (including critical care time)  Medications Ordered in UC Medications - No data to display  Initial Impression / Assessment and Plan / UC Course  I have reviewed the triage vital signs and the nursing notes.  Pertinent labs & imaging results that were available during my care of the patient were reviewed by me and considered in my medical decision making (see chart for details).     Suspect likely contusions and spraining.  Low suspicion of underlying fractures at this time.  Recommending conservative treatment with anti-inflammatories.  Cannot take NSAIDs, providing tramadol for severe pain.  Flexeril for any back/ neck pain.  No neuro deficits, do not suspect any facial fractures at this time.  Close monitoring,Discussed strict return precautions. Patient verbalized understanding and is agreeable with plan.  Final Clinical Impressions(s) / UC Diagnoses   Final diagnoses:  Contusion of face, initial encounter  Sprain of left wrist, initial encounter  Fall, initial encounter     Discharge Instructions     Continue Tylenol [phone] mg every 4-6 hours May use tramadol for severe Pain in his appointment Flexeril for neck/back pain-do not drive or work after taking, may cause drowsiness, use with caution with tramadol  Ice to face/arm Wrist brace for comfort  Follow-up if any symptoms improving or worsening    ED  Prescriptions    Medication Sig Dispense Auth. Provider   traMADol (ULTRAM) 50 MG tablet Take 1 tablet (50 mg total) by mouth every 6 (six) hours as needed. 15 tablet Annia Gomm C, PA-C   cyclobenzaprine (FLEXERIL) 5 MG tablet Take 1-2 tablets (5-10 mg total) by mouth 2 (two) times daily as needed for muscle spasms. 24 tablet Laree Garron, [place] C, PA-C     I have reviewed the PDMP during this encounter.   Lew Dawes, [place] 03/05/20 1212

## 2020-03-05 NOTE — Discharge Instructions (Signed)
Continue Tylenol [phone] mg every 4-6 hours May use tramadol for severe Pain in his appointment Flexeril for neck/back pain-do not drive or work after taking, may cause drowsiness, use with caution with tramadol  Ice to face/arm Wrist brace for comfort  Follow-up if any symptoms improving or worsening

## 2020-03-05 NOTE — ED Triage Notes (Signed)
Pt reports trip and fall off of sidewalk onto concrete on Wednesday night. Pt c/o pain to left arm and shoulder and facial pain/bruising   Tylenol taken at home without relief of headache  Denies LOC, denies blood thinners

## 2020-08-12 ENCOUNTER — Other Ambulatory Visit: Payer: Self-pay

## 2020-08-12 ENCOUNTER — Ambulatory Visit (HOSPITAL_COMMUNITY)
Admission: EM | Admit: 2020-08-12 | Discharge: 2020-08-12 | Disposition: A | Discharge status: Home / Self Care | Payer: HRSA Program | Attending: Family Medicine | Admitting: Family Medicine

## 2020-08-12 ENCOUNTER — Encounter (HOSPITAL_COMMUNITY): Payer: Self-pay

## 2020-08-12 DIAGNOSIS — J4520 Mild intermittent asthma, uncomplicated: Secondary | ICD-10-CM | POA: Diagnosis present

## 2020-08-12 DIAGNOSIS — R0600 Dyspnea, unspecified: Secondary | ICD-10-CM

## 2020-08-12 DIAGNOSIS — J069 Acute upper respiratory infection, unspecified: Secondary | ICD-10-CM

## 2020-08-12 DIAGNOSIS — U071 COVID-19: Secondary | ICD-10-CM | POA: Insufficient documentation

## 2020-08-12 DIAGNOSIS — Z20822 Contact with and (suspected) exposure to covid-19: Secondary | ICD-10-CM | POA: Diagnosis present

## 2020-08-12 LAB — SARS CORONAVIRUS 2 (TAT 6-24 HRS): SARS Coronavirus 2: POSITIVE — AB

## 2020-08-12 MED ORDER — ALBUTEROL SULFATE HFA 108 (90 BASE) MCG/ACT IN AERS: 2.0000 | INHALATION_SPRAY | Freq: Four times a day (QID) | RESPIRATORY_TRACT | 0 refills | Status: DC | PRN

## 2020-08-12 MED ORDER — TRAMADOL HCL 50 MG PO TABS: 50.0000 mg | ORAL_TABLET | Freq: Four times a day (QID) | ORAL | 0 refills | Status: DC | PRN

## 2020-08-12 NOTE — Discharge Instructions (Addendum)
Go home to rest Drink plenty of fluids Take Tylenol for pain or fever, if pain severe take Tramadol You may take over-the-counter cough and cold medicines as needed You must quarantine at home until your test result is available You can check for your test result in MyChart

## 2020-08-12 NOTE — ED Triage Notes (Signed)
Pt in with body aches, headache, ST, fever Tmax 101.5 tha has been going on since Monday. States she has been exposed to covid. Also states she has some trouble breathing  Denies any N/V, diarrhea  Pt has taken tylenol and mucinex with some relief

## 2020-08-12 NOTE — ED Provider Notes (Signed)
[facility]    CSN: [id number] Arrival date & time: 08/12/20  1103      History   Chief Complaint Chief Complaint  Patient presents with  . Shortness of Breath  . Headache  . Fever  . Generalized Body Aches    HPI Jeanne Huynh is a 54 y.o. female.   HPI   Patient had a house cast for 2 days.  She cannot wear the houseguest has Covid.  Patient has had 3 days of headache, body aches, fever and chills.  Temperature to 101.5.  She has cough and shortness of breath.  She has a history of asthma.  She has used all of her albuterol inhaler. No nausea vomiting diarrhea She has used Mucinex and Tylenol with some relief.  She states the headache is "severe"  Past Medical History:  Diagnosis Date  . Asthma   . Enlarged kidney   . Kidney stone     Patient Active Problem List   Diagnosis Date Noted  . Pyelonephritis 06/25/2013    History reviewed. No pertinent surgical history.  OB History   No obstetric history on file.      Home Medications    Prior to Admission medications   Medication Sig Start Date End Date Taking? Authorizing Provider  albuterol (VENTOLIN HFA) 108 (90 Base) MCG/ACT inhaler Inhale 2 puffs into the lungs every 6 (six) hours as needed for wheezing. 08/12/20   Eustace Moore, MD  ALPRAZolam Prudy Feeler) 1 MG tablet Take 1 mg by mouth 4 (four) times daily as needed for anxiety.     [provider]  cetirizine (ZYRTEC) 5 MG tablet Take 1 tablet (5 mg total) by mouth daily. 03/12/18   Dahlia Byes A, NP  citalopram (CELEXA) 20 MG tablet Take 20 mg by mouth at bedtime.    [provider]  cyclobenzaprine (FLEXERIL) 5 MG tablet Take 1-2 tablets (5-10 mg total) by mouth 2 (two) times daily as needed for muscle spasms. 03/05/20   Wieters, Hallie C, PA-C  traMADol (ULTRAM) 50 MG tablet Take 1 tablet (50 mg total) by mouth every 6 (six) hours as needed. 08/12/20   Eustace Moore, MD    Family History Family History  Problem  Relation Age of Onset  . Cancer Mother   . Heart failure Father     Social History Social History   Tobacco Use  . Smoking status: Current Every Day Smoker  . Smokeless tobacco: Never Used  Substance Use Topics  . Alcohol use: Yes     Allergies   Aspirin, Codeine, Motrin [ibuprofen], and Penicillins   Review of Systems Review of Systems See HPI  Physical Exam Triage Vital Signs ED Triage Vitals  Enc Vitals Group     BP 08/12/20 1300 (!) 139/98     Pulse Rate 08/12/20 1300 87     Resp 08/12/20 1300 18     Temp 08/12/20 1300 97.8 F (36.6 C)     Temp Source 08/12/20 1300 Axillary     SpO2 08/12/20 1300 97 %     Weight --      Height --      Head Circumference --      Peak Flow --      Pain Score 08/12/20 1259 7     Pain Loc --      Pain Edu? --      Excl. in GC? --    No data found.  Updated Vital Signs BP Marland Kitchen)  139/98 (BP Location: Right Arm)   Pulse 87   Temp 97.8 F (36.6 C) (Axillary)   Resp 18   LMP 05/04/2013   SpO2 97%      Physical Exam Constitutional:      General: She is not in acute distress.    Appearance: She is well-developed and well-nourished.     Comments: Voice is hoarse  HENT:     Head: Normocephalic and atraumatic.     Mouth/Throat:     Mouth: Oropharynx is clear and moist.     Comments: Mask is in place Eyes:     Conjunctiva/sclera: Conjunctivae normal.     Pupils: Pupils are equal, round, and reactive to light.  Cardiovascular:     Rate and Rhythm: Normal rate and regular rhythm.     Heart sounds: Normal heart sounds.  Pulmonary:     Effort: Pulmonary effort is normal. No respiratory distress.     Breath sounds: Normal breath sounds.     Comments: Lungs are clear Abdominal:     General: There is no distension.     Palpations: Abdomen is soft.  Musculoskeletal:        General: No edema. Normal range of motion.     Cervical back: Normal range of motion.  Skin:    General: Skin is warm and dry.  Neurological:      Mental Status: She is alert.  Psychiatric:        Behavior: Behavior normal.      UC Treatments / Results  Labs (all labs ordered are listed, but only abnormal results are displayed) Labs Reviewed  SARS CORONAVIRUS 2 (TAT 6-24 HRS)    EKG   Radiology No results found.  Procedures Procedures (including critical care time)  Medications Ordered in UC Medications - No data to display  Initial Impression / Assessment and Plan / UC Course  I have reviewed the triage vital signs and the nursing notes.  Pertinent labs & imaging results that were available during my care of the patient were reviewed by me and considered in my medical decision making (see chart for details).     Close Covid exposure now with febrile illness.  Discussed the importance of quarantine pending test results.  Reviewed symptomatic care Final Clinical Impressions(s) / UC Diagnoses   Final diagnoses:  Viral upper respiratory tract infection  Close exposure to COVID-19 virus  Dyspnea, unspecified type  Mild intermittent asthma, unspecified whether complicated     Discharge Instructions     Go home to rest Drink plenty of fluids Take Tylenol for pain or fever, if pain severe take Tramadol You may take over-the-counter cough and cold medicines as needed You must quarantine at home until your test result is available You can check for your test result in MyChart    ED Prescriptions    Medication Sig Dispense Auth. Provider   albuterol (VENTOLIN HFA) 108 (90 Base) MCG/ACT inhaler Inhale 2 puffs into the lungs every 6 (six) hours as needed for wheezing. 1 each Eustace Moore, MD   traMADol (ULTRAM) 50 MG tablet Take 1 tablet (50 mg total) by mouth every 6 (six) hours as needed. 15 tablet Eustace Moore, MD     I have reviewed the PDMP during this encounter.   Eustace Moore, MD 08/12/20 [phone]

## 2020-11-17 ENCOUNTER — Encounter (HOSPITAL_COMMUNITY): Payer: Self-pay

## 2020-11-17 ENCOUNTER — Other Ambulatory Visit: Payer: Self-pay

## 2020-11-17 ENCOUNTER — Ambulatory Visit (HOSPITAL_COMMUNITY)
Admission: EM | Admit: 2020-11-17 | Discharge: 2020-11-17 | Disposition: A | Discharge status: Home / Self Care | Payer: Self-pay | Attending: Family Medicine | Admitting: Family Medicine

## 2020-11-17 ENCOUNTER — Emergency Department (HOSPITAL_COMMUNITY): Payer: Self-pay

## 2020-11-17 ENCOUNTER — Emergency Department (HOSPITAL_COMMUNITY)
Admission: EM | Admit: 2020-11-17 | Discharge: 2020-11-18 | Disposition: A | Discharge status: Home / Self Care | Payer: Self-pay | Attending: Emergency Medicine | Admitting: Emergency Medicine

## 2020-11-17 DIAGNOSIS — R079 Chest pain, unspecified: Secondary | ICD-10-CM

## 2020-11-17 DIAGNOSIS — R030 Elevated blood-pressure reading, without diagnosis of hypertension: Secondary | ICD-10-CM

## 2020-11-17 DIAGNOSIS — M545 Low back pain, unspecified: Secondary | ICD-10-CM | POA: Insufficient documentation

## 2020-11-17 DIAGNOSIS — J449 Chronic obstructive pulmonary disease, unspecified: Secondary | ICD-10-CM | POA: Insufficient documentation

## 2020-11-17 DIAGNOSIS — R0789 Other chest pain: Secondary | ICD-10-CM | POA: Insufficient documentation

## 2020-11-17 DIAGNOSIS — R000 Tachycardia, unspecified: Secondary | ICD-10-CM

## 2020-11-17 DIAGNOSIS — G8929 Other chronic pain: Secondary | ICD-10-CM | POA: Insufficient documentation

## 2020-11-17 DIAGNOSIS — M79662 Pain in left lower leg: Secondary | ICD-10-CM | POA: Insufficient documentation

## 2020-11-17 DIAGNOSIS — F17200 Nicotine dependence, unspecified, uncomplicated: Secondary | ICD-10-CM | POA: Insufficient documentation

## 2020-11-17 DIAGNOSIS — R0602 Shortness of breath: Secondary | ICD-10-CM | POA: Insufficient documentation

## 2020-11-17 DIAGNOSIS — M79661 Pain in right lower leg: Secondary | ICD-10-CM | POA: Insufficient documentation

## 2020-11-17 DIAGNOSIS — J45909 Unspecified asthma, uncomplicated: Secondary | ICD-10-CM | POA: Insufficient documentation

## 2020-11-17 DIAGNOSIS — R531 Weakness: Secondary | ICD-10-CM | POA: Insufficient documentation

## 2020-11-17 LAB — URINALYSIS, ROUTINE W REFLEX MICROSCOPIC
Bacteria, UA: NONE SEEN
Bilirubin Urine: NEGATIVE
Glucose, UA: 150 mg/dL — AB
Ketones, ur: 80 mg/dL — AB
Leukocytes,Ua: NEGATIVE
Nitrite: NEGATIVE
Protein, ur: NEGATIVE mg/dL
Specific Gravity, Urine: 1.013 (ref 1.005–1.030)
pH: 6 (ref 5.0–8.0)

## 2020-11-17 LAB — CBC
HCT: 40.7 % (ref 36.0–46.0)
Hemoglobin: 14.3 g/dL (ref 12.0–15.0)
MCH: 31.8 pg (ref 26.0–34.0)
MCHC: 35.1 g/dL (ref 30.0–36.0)
MCV: 90.6 fL (ref 80.0–100.0)
Platelets: 279 10*3/uL (ref 150–400)
RBC: 4.49 MIL/uL (ref 3.87–5.11)
RDW: 12.4 % (ref 11.5–15.5)
WBC: 8.8 10*3/uL (ref 4.0–10.5)
nRBC: 0 % (ref 0.0–0.2)

## 2020-11-17 LAB — TROPONIN I (HIGH SENSITIVITY)
Troponin I (High Sensitivity): 7 ng/L (ref <18)
Troponin I (High Sensitivity): 9 ng/L (ref <18)

## 2020-11-17 LAB — BASIC METABOLIC PANEL
Anion gap: 11 (ref 5–15)
BUN: 9 mg/dL (ref 6–20)
CO2: 22 mmol/L (ref 22–32)
Calcium: 9.4 mg/dL (ref 8.9–10.3)
Chloride: 100 mmol/L (ref 98–111)
Creatinine, Ser: 0.88 mg/dL (ref 0.44–1.00)
GFR, Estimated: >60 mL/min (ref >60)
Glucose, Bld: 178 mg/dL — ABNORMAL HIGH (ref 70–99)
Potassium: 3.6 mmol/L (ref 3.5–5.1)
Sodium: 133 mmol/L — ABNORMAL LOW (ref 135–145)

## 2020-11-17 LAB — D-DIMER, QUANTITATIVE: D-Dimer, Quant: 0.95 ug/mL-FEU — ABNORMAL HIGH (ref 0.00–0.50)

## 2020-11-17 LAB — I-STAT BETA HCG BLOOD, ED (MC, WL, AP ONLY): I-stat hCG, quantitative: <5 m[IU]/mL (ref <5)

## 2020-11-17 MED ORDER — ACETAMINOPHEN 500 MG PO TABS: 1000.0000 mg | ORAL_TABLET | Freq: Four times a day (QID) | ORAL | 0 refills | Status: DC | PRN

## 2020-11-17 MED ORDER — BACLOFEN 10 MG PO TABS: 10.0000 mg | ORAL_TABLET | Freq: Three times a day (TID) | ORAL | 0 refills | Status: DC

## 2020-11-17 MED ORDER — ALPRAZOLAM 0.25 MG PO TABS
1.0000 mg | ORAL_TABLET | Freq: Once | ORAL | Status: AC
  Administered 2020-11-17: 1 mg via ORAL
  Filled 2020-11-17: qty 4

## 2020-11-17 MED ORDER — IOHEXOL 350 MG/ML SOLN
75.0000 mL | Freq: Once | INTRAVENOUS | Status: AC | PRN
  Administered 2020-11-17: 75 mL via INTRAVENOUS

## 2020-11-17 MED ORDER — SODIUM CHLORIDE 0.9 % IV BOLUS
1000.0000 mL | Freq: Once | INTRAVENOUS | Status: AC
  Administered 2020-11-17: 1000 mL via INTRAVENOUS

## 2020-11-17 MED ORDER — DICLOFENAC SODIUM 1 % EX GEL: 2.0000 g | Freq: Four times a day (QID) | CUTANEOUS | 0 refills | Status: DC

## 2020-11-17 MED ORDER — ACETAMINOPHEN 500 MG PO TABS
1000.0000 mg | ORAL_TABLET | Freq: Once | ORAL | Status: AC
  Administered 2020-11-17: 1000 mg via ORAL
  Filled 2020-11-17: qty 2

## 2020-11-17 NOTE — ED Triage Notes (Signed)
Pt in with c/o bilateral leg pain that has been going on for the last 3 days. states this morning she started having left and right side CP and sob. States she has been belching a lot   Pt also c/o knot on the bottom of her left foot that has been there for a few months, but has grown in size  Denies vomiting, pain in jaw/arm

## 2020-11-17 NOTE — ED Provider Notes (Signed)
Patient care accepted from resident.  Please see her note.  CT PE study is pending.  Plan is to follow-up on this and discharged with conservative therapy for musculoskeletal pain should the PE study be negative.  Physical Exam  BP 140/81   Pulse 71   Temp 98.1 F (36.7 C) (Oral)   Resp 11   LMP 05/04/2013   SpO2 99%   Physical Exam  ED Course/Procedures     Procedures  MDM  CT PE study is negative.  She will follow-up with family medicine.  Return precautions given.       Solon Augusta Emerson, [place] 11/17/20 2330    Maia Plan, MD 11/18/20 [phone]

## 2020-11-17 NOTE — ED Provider Notes (Addendum)
[facility] EMERGENCY DEPARTMENT Provider Note   CSN: [id number] Arrival date & time: 11/17/20  1200     History Chief Complaint  Patient presents with  . Leg Pain  . Chest Pain    Jeanne Huynh is a 55 y.o. female.  Patient is a 55 year old female with past medical history of COPD/asthma, tobacco abuse, with anxiety who presents with sudden onset shortness of breath and chest pain that started this morning around 930.  States that she has been having pain in her bilateral lower extremities mostly in her upper thighs that she describes as burning for the last 3 days.  Reports that she has chronic low back pain, has not had any changes in this.  She was seen in urgent care this morning and advised to come to the ED as they could not rule out PE.  She states that she has been feeling overall weak in the last 3 days, somewhat shaky.  She has been taking her medications as scheduled.  She denies any fevers, cough, congestion, known sick contacts.    She denies IV drug use, occasionally smokes THC, denies cocaine use, denies regular alcohol use, smokes 1 ppd     Past Medical History:  Diagnosis Date  . Asthma   . Enlarged kidney   . Kidney stone     Patient Active Problem List   Diagnosis Date Noted  . Pyelonephritis 06/25/2013    History reviewed. No pertinent surgical history.   OB History   No obstetric history on file.     Family History  Problem Relation Age of Onset  . Cancer Mother   . Heart failure Father     Social History   Tobacco Use  . Smoking status: Current Every Day Smoker  . Smokeless tobacco: Never Used  Substance Use Topics  . Alcohol use: Yes    Home Medications Prior to Admission medications   Medication Sig Start Date End Date Taking? Authorizing Provider  albuterol (VENTOLIN HFA) 108 (90 Base) MCG/ACT inhaler Inhale 2 puffs into the lungs every 6 (six) hours as needed for wheezing. 08/12/20   Eustace Moore, MD   ALPRAZolam Prudy Feeler) 1 MG tablet Take 1 mg by mouth 4 (four) times daily as needed for anxiety.     [provider]  cetirizine (ZYRTEC) 5 MG tablet Take 1 tablet (5 mg total) by mouth daily. 03/12/18   Dahlia Byes A, NP  citalopram (CELEXA) 20 MG tablet Take 20 mg by mouth at bedtime.    [provider]  cyclobenzaprine (FLEXERIL) 5 MG tablet Take 1-2 tablets (5-10 mg total) by mouth 2 (two) times daily as needed for muscle spasms. 03/05/20   Wieters, Hallie C, PA-C  traMADol (ULTRAM) 50 MG tablet Take 1 tablet (50 mg total) by mouth every 6 (six) hours as needed. 08/12/20   Eustace Moore, MD    Allergies    Aspirin, Codeine, Motrin [ibuprofen], and Penicillins  Review of Systems   Review of Systems  Constitutional: Negative for appetite change, chills, fatigue and fever.  HENT: Negative for congestion.   Respiratory: Positive for shortness of breath. Negative for cough.   Cardiovascular: Positive for chest pain. Negative for leg swelling.  Gastrointestinal: Negative for abdominal pain, diarrhea, nausea and vomiting.  Genitourinary: Positive for decreased urine volume (reports 2 urinations today, has drank half bottle of water). Negative for difficulty urinating, dysuria and hematuria.  Musculoskeletal: Positive for myalgias.  Skin: Negative for rash.  Neurological: Positive for weakness (generalized). Negative for dizziness, facial asymmetry, numbness and headaches.    Physical Exam Updated Vital Signs BP 140/81   Pulse 71   Temp 98.1 F (36.7 C) (Oral)   Resp 11   LMP 05/04/2013   SpO2 99%   Physical Exam Constitutional:      General: She is not in acute distress.    Appearance: She is well-developed.  HENT:     Head: Normocephalic and atraumatic.  Eyes:     Extraocular Movements: Extraocular movements intact.     Pupils: Pupils are equal, round, and reactive to light.     Comments: VF intact OU  Cardiovascular:     Rate and Rhythm: Normal rate and  regular rhythm.     Pulses:          Dorsalis pedis pulses are 2+ on the right side and 2+ on the left side.     Heart sounds: No murmur heard. No friction rub. No gallop.   Pulmonary:     Effort: Pulmonary effort is normal. No tachypnea or accessory muscle usage.     Breath sounds: No decreased breath sounds, wheezing, rhonchi or rales.  Chest:     Chest wall: No tenderness.  Abdominal:     Palpations: Abdomen is soft. There is no mass.     Tenderness: There is no abdominal tenderness. There is no guarding or rebound.  Musculoskeletal:     Cervical back: Normal range of motion and neck supple.     Right lower leg: No tenderness. No edema.     Left lower leg: No tenderness. No edema.     Comments: 5/5 strength BUE, BLE Lumbar spine without obvious deformity, no TTP along lumbar and lower thoracic spinous processes, no step off noted  Lymphadenopathy:     Cervical: No cervical adenopathy.  Skin:    General: Skin is warm and dry.     Capillary Refill: Capillary refill takes less than 2 seconds.  Neurological:     General: No focal deficit present.     Mental Status: She is alert.     Motor: No weakness.     Comments: Sensation intact throughout BUE/BLE Negative straight leg raise b/l  Psychiatric:        Mood and Affect: Mood is anxious.     ED Results / Procedures / Treatments   Labs (all labs ordered are listed, but only abnormal results are displayed) Labs Reviewed  BASIC METABOLIC PANEL - Abnormal; Notable for the following components:      Result Value   Sodium 133 (*)    Glucose, Bld 178 (*)    All other components within normal limits  URINALYSIS, ROUTINE W REFLEX MICROSCOPIC - Abnormal; Notable for the following components:   Glucose, UA 150 (*)    Hgb urine dipstick SMALL (*)    Ketones, ur 80 (*)    All other components within normal limits  D-DIMER, QUANTITATIVE - Abnormal; Notable for the following components:   D-Dimer, Quant 0.95 (*)    All other  components within normal limits  CBC  I-STAT BETA HCG BLOOD, ED ([facility], [facility], [facility] ONLY)  TROPONIN I (HIGH SENSITIVITY)  TROPONIN I (HIGH SENSITIVITY)    EKG None  Radiology DG Chest 2 View  Result Date: 11/17/2020 CLINICAL DATA:  Chest pain and short of breath. EXAM: CHEST - 2 VIEW COMPARISON:  10/28/2018 FINDINGS: Moderate pulmonary hypertension. Lungs are clear without infiltrate or effusion. Heart size and vascularity  normal. IMPRESSION: COPD.  No acute cardiopulmonary abnormality. Electronically Signed   By: Marlan Palau M.D.   On: 11/17/2020 13:25    Procedures Procedures   Medications Ordered in ED Medications  sodium chloride 0.9 % bolus 1,000 mL (has no administration in time range)  acetaminophen (TYLENOL) tablet 1,000 mg (has no administration in time range)  ALPRAZolam Prudy Feeler) tablet 1 mg (1 mg Oral Given 11/17/20 2207)  iohexol (OMNIPAQUE) 350 MG/ML injection 75 mL (75 mLs Intravenous Contrast Given 11/17/20 2314)    ED Course  I have reviewed the triage vital signs and the nursing notes.  Pertinent labs & imaging results that were available during my care of the patient were reviewed by me and considered in my medical decision making (see chart for details).    MDM Rules/Calculators/A&P                          Patient is a 55 yo female with PMH COPD/asthma, tobacco abuse, anxiety who presents with sudden onset shortness of breath and chest pain that has now resolved.  Largest complaint is bilateral lower extremity upper thigh feeling of weakness and burning pain.  She was sent from urgent care to rule out PE.  She was initially tachycardic to urgent care to 120s, has heart rate in ED 108 on arrival.  Vital signs have been stable aside from mild hypertension.  Physical exam is nonrevealing.  She does not have any pain or swelling in her calves.  No prior history of blood clot.  She does smoke.  Initial BMP, CBC without obvious abnormality.  Troponins negative, 7 and 9.  Will  obtain UA and D-dimer.  If D-Dimer negative, can likely discharge home with f/u with new PCP.    D-Dimer elevated at 0.95.  Will obtain CTA to rule out PE.  Patient's breathing improved with administration of home alprazolam 1 mg.  PMP reviewed, ensure that this is patient's accurate dose.  She did not miss any previous doses, it is unlikely that her symptoms were caused by withdrawal to her benzo.  UA does show 150 glucose, 80 ketones.  Her BMP did not show anion gap.  Discussed with patient that she should follow-up with her new PCP, she does not have a prior history of diabetes, however diabetes could be a cause of glucosuria.  No DKA, however, so could have close outpatient f/u.  She has not been drinking many fluids today, therefore will give 1L NS while awaiting CTA.  Signed out to [employer], [place].     Final Clinical Impression(s) / ED Diagnoses Final diagnoses:  None    Rx / DC Orders ED Discharge Orders    None       Unknown Jim, DO 11/17/20 2306    Obie Dredge, Solmon Ice, DO 11/17/20 2315    Maia Plan, MD 11/18/20 0009

## 2020-11-17 NOTE — ED Provider Notes (Signed)
[facility]   [id number] 11/17/20 Arrival Time: 1101  ASSESSMENT & PLAN:  1. Chest pain, unspecified type   2. SOB (shortness of breath)   3. Tachycardia   4. Elevated blood pressure reading without diagnosis of hypertension     Cannot r/o PE. Discussed. She agrees to ED evaluation. Declines EMS. To ED via private vehicle. Stable upon discharge.  ECG: Performed today and interpreted by me: sinus tachycardia. S1, Q3. No signs of ischemia. No STEMI.   Follow-up Information    Go to  [facility] EMERGENCY DEPARTMENT.   Specialty: Emergency Medicine Contact information: [address] [id number] [place]he[place] Bon[place] [address] [phone]              Reviewed expectations re: course of current medical issues. Questions answered. Outlined signs and symptoms indicating need for more acute intervention. Patient verbalized understanding. After Visit Summary given.   SUBJECTIVE:  History from: patient. SHAWANNA ZANDERS is a 55 y.o. female who reports non-radiating chest pain over past 2-3 days; fairly abrupt onset; worse this morning with assoc SOB. Without assoc n/v/diaphoresis. SOB and CP worse with deep breaths. Smokes cigarettes. No OCP use. No recent prolonged immobilization. Also reports bilateral leg pains with 'Tingling sensations' present at times. Does have GERD and has been belching more frequently. Questions relation to chest symptoms. Appetite normal. Unrelated, reports 'knot' on platar L foot. Unsure of how long this has been present. Painful with weight bearing.  Denies: irregular heart beat, lower extremity edema, near-syncope, orthopnea, paroxysmal nocturnal dyspnea and syncope. Denies street drug use.  Social History   Tobacco Use  Smoking Status Current Every Day Smoker  Smokeless Tobacco Never Used   Social History   Substance and Sexual Activity  Alcohol Use Yes     OBJECTIVE:  Vitals:   11/17/20 1131  11/17/20 1142  BP: (!) 185/96   Pulse: (!) 122   Resp: 20   Temp:  98.4 F (36.9 C)  SpO2: 98%     Tachycardia and elevated BP noted.  General appearance: alert, oriented, no acute distress; appears anxious Eyes: PERRLA; EOMI; conjunctivae normal HENT: normocephalic; atraumatic Neck: supple with FROM Lungs: without labored respirations; no wheezing or rales; speaks full sentences without difficulty; CTAB Heart: regular; tachycardic Chest Wall: without tenderness to palpation Abdomen: soft, non-tender; no guarding or rebound tenderness Extremities: without edema; without calf swelling or tenderness; symmetrical without gross deformities except for firm knot on plantar left foot that is tender; without overlying erythema Skin: warm and dry; without rash or lesions Neuro: normal gait Psychological: alert and cooperative; normal mood and affect   Allergies  Allergen Reactions  . Aspirin Other (See Comments)    "made me have a asthma attack"  . Codeine Nausea Only  . Motrin [Ibuprofen] Nausea Only    Cant take high amounts  . Penicillins Other (See Comments)    "lost 10 pounds while taking it" Has patient had a PCN reaction causing immediate rash, facial/tongue/throat swelling, SOB or lightheadedness with hypotension: No Has patient had a PCN reaction causing severe rash involving mucus membranes or skin necrosis: No Has patient had a PCN reaction that required hospitalization No Has patient had a PCN reaction occurring within the last 10 years: No If all of the above answers are "NO", then may proceed with Cephalosporin use.     Past Medical History:  Diagnosis Date  . Asthma   . Enlarged kidney   . Kidney stone  Social History   Socioeconomic History  . Marital status: Married    Spouse name: Not on file  . Number of children: Not on file  . Years of education: Not on file  . Highest education level: Not on file  Occupational History  . Not on file  Tobacco Use   . Smoking status: Current Every Day Smoker  . Smokeless tobacco: Never Used  Substance and Sexual Activity  . Alcohol use: Yes  . Drug use: Not on file  . Sexual activity: Yes  Other Topics Concern  . Not on file  Social History Narrative  . Not on file   Social Determinants of Health   Financial Resource Strain: Not on file  Food Insecurity: Not on file  Transportation Needs: Not on file  Physical Activity: Not on file  Stress: Not on file  Social Connections: Not on file  Intimate Partner Violence: Not on file   Family History  Problem Relation Age of Onset  . Cancer Mother   . Heart failure Father    History reviewed. No pertinent surgical history.   Mardella Layman, MD 11/17/20 1312

## 2020-11-17 NOTE — Discharge Instructions (Addendum)
Please take tylenol, baclofen and use voltaren gel as prescribed.   Please follow up with family medicine. Information for their office provided.

## 2020-11-17 NOTE — ED Triage Notes (Signed)
Pt reports was sent here from UC due to cp,sob, leg pain bilateral for the past x3 days. Pt has h/o asthma.

## 2020-12-22 NOTE — Progress Notes (Signed)
    SUBJECTIVE:   CHIEF COMPLAINT / HPI: bilateral leg pain   Patient reports that she was seen at [facility] for leg pain that is characterized as stinging and burning. She reports that she wrapped legs in warmer for two days with no improvement in the pain. She could not control her legs, felt off balance and later developed SOB with chest pains prompting her to go to the UC and later ED.   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, broadcasting/film/video for several years and thinks this may be contributing to her pain She reports stumbling in the household due to her legs feeling uncoordinated Does not usually start in hip area, pain often goes through her both of her legs and into her feet Denies any skin changes or swelling during the episodes  Pain will usually last all day  Today is 5/10 in severity, today pain is localized to her thighs  Was concerned for elevated BG on lab work from ED and wonders if diabetes may be contributing to her symptoms She reports history of having multiple ankle injuries and had a surgery where several screws and plates were placed in her right lower extremity   SH  She reports smoking cigarettes but stopped smoking for three weeks after going to hospital with leg pain, she has since resumed Patient reports smoking since 32 years, usually 1.5-2 PPD  Occasionally drinks mixed drink or bud lite  Denies recreational drug use   FH  Family hx signficant for arthritis   PERTINENT  PMH / PSH:  COPD    OBJECTIVE:   BP 112/78   Pulse (!) 106   Ht $R'5\' 6"'wj$  (1.676 m)   Wt 110 lb 3.2 oz (50 kg)   LMP 05/04/2013   SpO2 97%   BMI 17.79 kg/m   General: female appearing stated age in no acute distress, slightly tired appearing  Cardio: Normal S1 and S2, no S3 or S4. Rhythm is regular. No murmurs or rubs.  Bilateral radial pulses palpable, mildly tachycardic  Pulm: Clear to auscultation  bilaterally, no crackles, wheezing, or diminished breath sounds. Normal respiratory effort, stable on RA Abdomen: Bowel sounds normal. Abdomen soft and non-tender.  Extremities: No peripheral edema. Warm/ well perfused. No edema or skin changes noted to LEs, ambulating normally  Neuro: pt alert and oriented x4, sensation of bilateral LEs intact to light touch, 5/5 strength   ASSESSMENT/PLAN:   Leg pain, bilateral Patient reports chronic bilateral lower extremity pain described as burning/tingling and weakness.  She reports that this is no worse in the last few months.  Recently had episode where she was evaluated in urgent care. Considering rheumatologic etiology for pain and weakness, also consider neuropathic cause, will also consider potential rhabdomyolysis. -CMP -CK -ESR, CRP -TSH -CBC     Eulis Foster, MD [place]

## 2020-12-23 ENCOUNTER — Other Ambulatory Visit: Payer: Self-pay

## 2020-12-23 ENCOUNTER — Ambulatory Visit (INDEPENDENT_AMBULATORY_CARE_PROVIDER_SITE_OTHER): Payer: Self-pay | Admitting: Family Medicine

## 2020-12-23 ENCOUNTER — Encounter: Payer: Self-pay | Admitting: Family Medicine

## 2020-12-23 VITALS — BP 112/78 | HR 106 | Ht 66.0 in | Wt 110.2 lb

## 2020-12-23 DIAGNOSIS — M79605 Pain in left leg: Secondary | ICD-10-CM

## 2020-12-23 DIAGNOSIS — Z1159 Encounter for screening for other viral diseases: Secondary | ICD-10-CM

## 2020-12-23 DIAGNOSIS — M79604 Pain in right leg: Secondary | ICD-10-CM

## 2020-12-23 LAB — POCT URINALYSIS DIP (MANUAL ENTRY)
Bilirubin, UA: NEGATIVE
Glucose, UA: NEGATIVE mg/dL
Ketones, POC UA: NEGATIVE mg/dL
Leukocytes, UA: NEGATIVE
Nitrite, UA: NEGATIVE
Protein Ur, POC: NEGATIVE mg/dL
Spec Grav, UA: 1.015 (ref 1.010–1.025)
Urobilinogen, UA: 0.2 E.U./dL
pH, UA: 6.5 (ref 5.0–8.0)

## 2020-12-23 LAB — POCT UA - MICROSCOPIC ONLY

## 2020-12-23 MED ORDER — ACETAMINOPHEN 500 MG PO TABS: 1000.0000 mg | ORAL_TABLET | Freq: Four times a day (QID) | ORAL | 1 refills | Status: DC | PRN

## 2020-12-23 MED ORDER — BACLOFEN 10 MG PO TABS: 10.0000 mg | ORAL_TABLET | Freq: Three times a day (TID) | ORAL | 1 refills | Status: DC

## 2020-12-23 NOTE — Patient Instructions (Signed)
It was a pleasure to see you today!  Thank you for choosing [facility] for your primary care.   Jeanne Huynh was seen for leg pain.   Our plans for today were:  Leg pain: We will collect blood test today to evaluate for potential causes of your leg pain.  We will also collect blood work to test for HIV and hepatitis C is recommended.   To keep you healthy, please keep in mind the following health maintenance items that you are due for:   1. COVID-vaccine 2. Pap smear 3. Colonoscopy 4. Tetanus   You should return to our clinic in 3 weeks for follow-up for leg.   Best Wishes,   Dr. Neita Garnet    Sciatica Rehab Stretching and range-of-motion exercises These exercises warm up your muscles and joints and improve the movement and flexibility of your hips and back. These exercises also help to relieve pain, numbness, and tingling. Sciatic nerve glide 1. Sit in a chair with your head facing down toward your chest. Place your hands behind your back. Let your shoulders slump forward. 2. Slowly straighten one of your legs while you tilt your head back as if you are looking toward the ceiling. Only straighten your leg as far as you can without making your symptoms worse. 3. Hold this position for __________ seconds. 4. Slowly return to the starting position. 5. Repeat with your other leg. Repeat __________ times. Complete this exercise __________ times a day. Knee to chest with hip adduction and internal rotation 1. Lie on your back on a firm surface with both legs straight. 2. Bend one of your knees and move it up toward your chest until you feel a gentle stretch in your lower back and buttock. Then, move your knee toward the shoulder that is on the opposite side from your leg. This is hip adduction and internal rotation. ? Hold your leg in this position by holding on to the front of your knee. 3. Hold this position for __________ seconds. 4. Slowly return to the  starting position. 5. Repeat with your other leg. Repeat __________ times. Complete this exercise __________ times a day.   Prone extension on elbows 1. Lie on your abdomen on a firm surface. A bed may be too soft for this exercise. 2. Prop yourself up on your elbows. 3. Use your arms to help lift your chest up until you feel a gentle stretch in your abdomen and your lower back. ? This will place some of your body weight on your elbows. If this is uncomfortable, try stacking pillows under your chest. ? Your hips should stay down, against the surface that you are lying on. Keep your hip and back muscles relaxed. 4. Hold this position for __________ seconds. 5. Slowly relax your upper body and return to the starting position. Repeat __________ times. Complete this exercise __________ times a day.   Strengthening exercises These exercises build strength and endurance in your back. Endurance is the ability to use your muscles for a long time, even after they get tired. Pelvic tilt This exercise strengthens the muscles that lie deep in the abdomen. 1. Lie on your back on a firm surface. Bend your knees and keep your feet flat on the floor. 2. Tense your abdominal muscles. Tip your pelvis up toward the ceiling and flatten your lower back into the floor. ? To help with this exercise, you may place a small towel under your lower back and try to  push your back into the towel. 3. Hold this position for __________ seconds. 4. Let your muscles relax completely before you repeat this exercise. Repeat __________ times. Complete this exercise __________ times a day. Alternating arm and leg raises 1. Get on your hands and knees on a firm surface. If you are on a hard floor, you may want to use padding, such as an exercise mat, to cushion your knees. 2. Line up your arms and legs. Your hands should be directly below your shoulders, and your knees should be directly below your hips. 3. Lift your left leg  behind you. At the same time, raise your right arm and straighten it in front of you. ? Do not lift your leg higher than your hip. ? Do not lift your arm higher than your shoulder. ? Keep your abdominal and back muscles tight. ? Keep your hips facing the ground. ? Do not arch your back. ? Keep your balance carefully, and do not hold your breath. 4. Hold this position for __________ seconds. 5. Slowly return to the starting position. 6. Repeat with your right leg and your left arm. Repeat __________ times. Complete this exercise __________ times a day.   Posture and body mechanics Good posture and healthy body mechanics can help to relieve stress in your body's tissues and joints. Body mechanics refers to the movements and positions of your body while you do your daily activities. Posture is part of body mechanics. Good posture means:  Your spine is in its natural S-curve position (neutral).  Your shoulders are pulled back slightly.  Your head is not tipped forward. Follow these guidelines to improve your posture and body mechanics in your everyday activities. Standing  When standing, keep your spine neutral and your feet about hip width apart. Keep a slight bend in your knees. Your ears, shoulders, and hips should line up.  When you do a task in which you stand in one place for a long time, place one foot up on a stable object that is 2-4 inches (5-10 cm) high, such as a footstool. This helps keep your spine neutral.   Sitting  When sitting, keep your spine neutral and keep your feet flat on the floor. Use a footrest, if necessary, and keep your thighs parallel to the floor. Avoid rounding your shoulders, and avoid tilting your head forward.  When working at a desk or a computer, keep your desk at a height where your hands are slightly lower than your elbows. Slide your chair under your desk so you are close enough to maintain good posture.  When working at a computer, place your  monitor at a height where you are looking straight ahead and you do not have to tilt your head forward or downward to look at the screen.   Resting  When lying down and resting, avoid positions that are most painful for you.  If you have pain with activities such as sitting, bending, stooping, or squatting, lie in a position in which your body does not bend very much. For example, avoid curling up on your side with your arms and knees near your chest (fetal position).  If you have pain with activities such as standing for a long time or reaching with your arms, lie with your spine in a neutral position and bend your knees slightly. Try the following positions: ? Lying on your side with a pillow between your knees. ? Lying on your back with a pillow under your knees.  Lifting  When lifting objects, keep your feet at least shoulder width apart and tighten your abdominal muscles.  Bend your knees and hips and keep your spine neutral. It is important to lift using the strength of your legs, not your back. Do not lock your knees straight out.  Always ask for help to lift heavy or awkward objects.   This information is not intended to replace advice given to you by your health care provider. Make sure you discuss any questions you have with your health care provider. Document Revised: 11/22/2018 Document Reviewed: 08/22/2018 Elsevier Patient Education  [employer].

## 2020-12-23 NOTE — Assessment & Plan Note (Signed)
Patient reports chronic bilateral lower extremity pain described as burning/tingling and weakness.  She reports that this is no worse in the last few months.  Recently had episode where she was evaluated in urgent care. Considering rheumatologic etiology for pain and weakness, also consider neuropathic cause, will also consider potential rhabdomyolysis. -CMP -CK -ESR, CRP -TSH -CBC

## 2020-12-24 LAB — COMPREHENSIVE METABOLIC PANEL
ALT: 15 IU/L (ref 0–32)
AST: 22 IU/L (ref 0–40)
Albumin/Globulin Ratio: 2.2 (ref 1.2–2.2)
Albumin: 5.1 g/dL — ABNORMAL HIGH (ref 3.8–4.9)
Alkaline Phosphatase: 79 IU/L (ref 44–121)
BUN/Creatinine Ratio: 16 (ref 9–23)
BUN: 13 mg/dL (ref 6–24)
Bilirubin Total: 0.3 mg/dL (ref 0.0–1.2)
CO2: 22 mmol/L (ref 20–29)
Calcium: 9.8 mg/dL (ref 8.7–10.2)
Chloride: 99 mmol/L (ref 96–106)
Creatinine, Ser: 0.79 mg/dL (ref 0.57–1.00)
Globulin, Total: 2.3 g/dL (ref 1.5–4.5)
Glucose: 95 mg/dL (ref 65–99)
Potassium: 4.3 mmol/L (ref 3.5–5.2)
Sodium: 139 mmol/L (ref 134–144)
Total Protein: 7.4 g/dL (ref 6.0–8.5)
eGFR: 89 mL/min/{1.73_m2} (ref >59)

## 2020-12-24 LAB — SEDIMENTATION RATE: Sed Rate: 9 mm/hr (ref 0–40)

## 2020-12-24 LAB — CBC
Hematocrit: 42 % (ref 34.0–46.6)
Hemoglobin: 13.9 g/dL (ref 11.1–15.9)
MCH: 30.7 pg (ref 26.6–33.0)
MCHC: 33.1 g/dL (ref 31.5–35.7)
MCV: 93 fL (ref 79–97)
Platelets: 282 10*3/uL (ref 150–450)
RBC: 4.53 x10E6/uL (ref 3.77–5.28)
RDW: 13.1 % (ref 11.7–15.4)
WBC: 6.3 10*3/uL (ref 3.4–10.8)

## 2020-12-24 LAB — MAGNESIUM: Magnesium: 2.2 mg/dL (ref 1.6–2.3)

## 2020-12-24 LAB — C-REACTIVE PROTEIN: CRP: 1 mg/L (ref 0–10)

## 2020-12-24 LAB — HEPATITIS C ANTIBODY: Hep C Virus Ab: <0.1 s/co ratio (ref 0.0–0.9)

## 2020-12-24 LAB — CK: Total CK: 61 U/L (ref 32–182)

## 2020-12-24 LAB — TSH: TSH: 1.52 u[IU]/mL (ref 0.450–4.500)

## 2020-12-24 LAB — HIV ANTIBODY (ROUTINE TESTING W REFLEX): HIV Screen 4th Generation wRfx: NONREACTIVE

## 2021-01-13 NOTE — Progress Notes (Signed)
    SUBJECTIVE:   CHIEF COMPLAINT / HPI: follow up for BLE pain   Patient was evaluated on 5/12 for bilateral LE pain and weakness that has been chronic concern. Today she reports she has no leg pain or stinging and burning today. She states that today is a good day but yesterday felt like she had some weakness in the legs yesterday. Also reports having some weakness while driving.  Lab work from prior visit was normal for CMP, CK, CBC, HIV and TSH.    Reports that she saw lysed red blood cells in her urine sample from the 5/12 visit without comment for hemoglobin on U/A.   PERTINENT  PMH / PSH:  Pyelonephritis   OBJECTIVE:   BP (!) 142/91   Pulse 93   Ht 5\' 6"  (1.676 m)   Wt 114 lb 12.8 oz (52.1 kg)   LMP 05/04/2013   SpO2 100%   BMI 18.53 kg/m   General: female appearing stated age in no acute distress Extrmities: 4/5 strength on RLE, 5/5 LLE, no skin changes, no signs of muscle atrophy, LEs are symmetry in appearance, sensation is intact to light touch, normal gait  REsp: normal respiratory effort  Cardio: RRR     ASSESSMENT/PLAN:   Leg pain, bilateral Normal lab work on last visit. Reports decreased frequency of lower leg muscle spasms with muscle relaxer previously prescribed, otherwise is still having episodes of bilateral LE weakness, although is having no pain today. Exam notable for LE weakness on Right LE today. Suspect patient may have component of neuropathic pain.  - prescribe gabapentin 100qHS to titrate up to TID depending on response and drowsiness  - lumbar xray   Does not have health insurance Patient requests assistance with signing up for health insurance  - CCM referral placed  - patient counseled on process and to expect a call  - patient agreeable with this plan   Abnormal urine finding Prior U/A with trace lysed red blood cells. No hemoglobin noted of U/A. Hx of pyelonephritis. Patient denies gross hematuria.  - repeat U/A - if abnormal, will  evaluate for etiology of microscopic hematuria      05/06/2013, MD [facility]

## 2021-01-14 ENCOUNTER — Ambulatory Visit
Admission: RE | Admit: 2021-01-14 | Discharge: 2021-01-14 | Disposition: A | Discharge status: Home / Self Care | Payer: Self-pay | Source: Ambulatory Visit | Attending: Family Medicine | Admitting: Family Medicine

## 2021-01-14 ENCOUNTER — Ambulatory Visit (INDEPENDENT_AMBULATORY_CARE_PROVIDER_SITE_OTHER): Payer: Self-pay | Admitting: Family Medicine

## 2021-01-14 ENCOUNTER — Other Ambulatory Visit: Payer: Self-pay

## 2021-01-14 ENCOUNTER — Encounter: Payer: Self-pay | Admitting: Family Medicine

## 2021-01-14 VITALS — BP 142/91 | HR 93 | Ht 66.0 in | Wt 114.8 lb

## 2021-01-14 DIAGNOSIS — M79604 Pain in right leg: Secondary | ICD-10-CM

## 2021-01-14 DIAGNOSIS — R3129 Other microscopic hematuria: Secondary | ICD-10-CM

## 2021-01-14 DIAGNOSIS — R8290 Unspecified abnormal findings in urine: Secondary | ICD-10-CM

## 2021-01-14 DIAGNOSIS — R319 Hematuria, unspecified: Secondary | ICD-10-CM

## 2021-01-14 DIAGNOSIS — M79605 Pain in left leg: Secondary | ICD-10-CM

## 2021-01-14 DIAGNOSIS — Z5989 Other problems related to housing and economic circumstances: Secondary | ICD-10-CM

## 2021-01-14 LAB — POCT URINALYSIS DIP (MANUAL ENTRY)
Bilirubin, UA: NEGATIVE
Blood, UA: NEGATIVE
Glucose, UA: NEGATIVE mg/dL
Ketones, POC UA: NEGATIVE mg/dL
Leukocytes, UA: NEGATIVE
Nitrite, UA: NEGATIVE
Protein Ur, POC: NEGATIVE mg/dL
Spec Grav, UA: 1.01 (ref 1.010–1.025)
Urobilinogen, UA: 0.2 E.U./dL
pH, UA: 7 (ref 5.0–8.0)

## 2021-01-14 MED ORDER — GABAPENTIN 100 MG PO CAPS: 100.0000 mg | ORAL_CAPSULE | Freq: Every day | ORAL | 0 refills | Status: DC

## 2021-01-14 NOTE — Patient Instructions (Addendum)
It was a pleasure to see you today!  Thank you for choosing [facility] for your primary care.   Jeanne Huynh was seen for leg pain follow up.   Our plans for today were:  We will start a medication trial for gabapentin if it helps with your leg painful sensations.   I also recommend going to [address] to [facility] to image your lumbar spine for any reasons that may be causing your symptoms.   I will also connect you with our chronic care management team to help with enrolling in insurance. Please be on the lookout for a call from them to help coordinate this.   To keep you healthy, please keep in mind the following health maintenance items that you are due for:   1. Pap Smear  2. Mammogram  3. COVID vaccine  4. Tetanus vaccine  5. Shingles vaccine    You should return to our clinic in 2 weeks for leg pain, pap smear and vaccines.   Best Wishes,   Dr. Neita Garnet

## 2021-01-16 DIAGNOSIS — R8290 Unspecified abnormal findings in urine: Secondary | ICD-10-CM | POA: Insufficient documentation

## 2021-01-16 DIAGNOSIS — Z5989 Other problems related to housing and economic circumstances: Secondary | ICD-10-CM | POA: Insufficient documentation

## 2021-01-16 NOTE — Assessment & Plan Note (Signed)
Normal lab work on last visit. Reports decreased frequency of lower leg muscle spasms with muscle relaxer previously prescribed, otherwise is still having episodes of bilateral LE weakness, although is having no pain today. Exam notable for LE weakness on Right LE today. Suspect patient may have component of neuropathic pain.  - prescribe gabapentin 100qHS to titrate up to TID depending on response and drowsiness  - lumbar xray

## 2021-01-16 NOTE — Assessment & Plan Note (Addendum)
Patient requests assistance with signing up for health insurance  - CCM referral placed  - patient counseled on process and to expect a call  - patient agreeable with this plan

## 2021-01-16 NOTE — Assessment & Plan Note (Signed)
Prior U/A with trace lysed red blood cells. No hemoglobin noted of U/A. Hx of pyelonephritis. Patient denies gross hematuria.  - repeat U/A - if abnormal, will evaluate for etiology of microscopic hematuria

## 2021-01-18 ENCOUNTER — Telehealth: Payer: Self-pay | Admitting: *Deleted

## 2021-01-18 NOTE — Telephone Encounter (Signed)
   Telephone encounter was:  Successful.  01/18/2021 Name: Jeanne Huynh MRN: [id number] DOB: 1966-03-30  [name] is a 55 y.o. year old female who is a primary care patient of Simmons-Robinson, Makiera, MD . The community resource team was consulted for assistance with Going to email patient information about the health care market insurance and she will see what she can afford males to much for medicaid and wants [facility]  Care guide performed the following interventions: Patient provided with information about care guide support team and interviewed to confirm resource needs.  Follow Up Plan:  No further follow up planned at this time. The patient has been provided with needed resources. Alois Cliche -[facility] Guide , Embedded Care Coordination [facility], Care Management  [phone] [address] [place] , [place] [address] Email : [email or web address]

## 2021-01-27 NOTE — Progress Notes (Signed)
    SUBJECTIVE:   CHIEF COMPLAINT / HPI:   Ms. Tibbett is a 55 yo who presents to follow up on her leg pain. She was also scheduled for a pap smear but she requests not to have that done today.   Hypertension: Initial BP elevated, and on repeat decreased to 137/90. Denies CP, headaches, blurry vision. Endorses chronic SOB due to asthma.   Patient was seen on 5/12 for her leg stiffness, stinging and burning. Labs unremarkable. Lumbar XR showing slight increase in lumbar spondylosis from L2-L4. She was prescribed gabapentin and has been using muscle relaxer which she says has all been helping and she denies current leg pain.    OBJECTIVE:   BP 137/90   Pulse (!) 106   Wt 114 lb 6.4 oz (51.9 kg)   LMP 05/04/2013   SpO2 96%   BMI 18.46 kg/m    General: alert, pleasant, NAD CV: RRR no murmurs Resp: CTAB normal WOB MSK: No lesions appreciated. normal strength and tone. Non tender to palpation   ASSESSMENT/PLAN:   No problem-specific Assessment & Plan notes found for this encounter.   Leg pain, bilateral Chronic bilateral burning and tingling. Normal labs. Lumbar XR showing spondylosis between L2-L4. Improved with Gabapentin, Tylenol, and muscle relaxer. Has also been doing exercises given at last visit - continue medications and exercises above   Health maintenance items Declined COVID-vaccine and Tetanus. Will schedule appt with PCP for pap smear in the next couple of weeks.    Cora Collum, DO [facility]

## 2021-01-28 ENCOUNTER — Ambulatory Visit (INDEPENDENT_AMBULATORY_CARE_PROVIDER_SITE_OTHER): Payer: Self-pay | Admitting: Family Medicine

## 2021-01-28 ENCOUNTER — Other Ambulatory Visit: Payer: Self-pay

## 2021-01-28 ENCOUNTER — Encounter: Payer: Self-pay | Admitting: Family Medicine

## 2021-01-28 VITALS — BP 137/90 | HR 106 | Wt 114.4 lb

## 2021-01-28 DIAGNOSIS — M79605 Pain in left leg: Secondary | ICD-10-CM

## 2021-01-28 DIAGNOSIS — M79604 Pain in right leg: Secondary | ICD-10-CM

## 2021-01-28 NOTE — Patient Instructions (Signed)
It was great seeing you today!  Today you came in for results from your previous visit. Your labs were normal, and your back x ray shows lower spinal narrowing which is likely age related wear and tear. You can continue the stretches and medicatoin recommended by Dr. Neita Garnet which seem to be working well for you! Please return if you have worsening of your back and leg pain.   Also, the contact information you requested is below:   Alois Cliche -[facility] Guide , Embedded Care Coordination [facility], Care Management [phone] [address] [place] , [place] [address] Email : [email or web address]  Please check-out at the front desk before leaving the clinic. Please schedule to see your PCP for your pap smear in the next couple of weeks, but if you need to be seen earlier than that for any new issues we're happy to fit you in, just give [place] a call!  Feel free to call with any questions or concerns at any time, at [phone].   Take care,  Dr. Cora Collum [facility]    Recombinant Zoster (Shingles) Vaccine: What You Need to Know 1. Why get vaccinated? Recombinant zoster (shingles) vaccine can prevent shingles. Shingles (also called herpes zoster, or just zoster) is a painful skin rash, usually with blisters. In addition to the rash, shingles can cause fever, headache, chills, or upset stomach. More rarely, shingles can lead to pneumonia, hearingproblems, blindness, brain inflammation (encephalitis), or death. The most common complication of shingles is long-term nerve pain called postherpetic neuralgia (PHN). PHN occurs in the areas where the shingles rash was, even after the rash clears up. It can last for months or years after therash goes away. The pain from PHN can be severe and debilitating. About 10 to 18% of people who get shingles will experience PHN. The risk of PHN increases with age. An older adult with shingles  is more likely to develop PHN and have longer lasting and more severe pain than a younger person withshingles. Shingles is caused by the varicella zoster virus, the same virus that causes chickenpox. After you have chickenpox, the virus stays in your body and can cause shingles later in life. Shingles cannot be passed from one person to another, but the virus that causes shingles can spread and cause chickenpox insomeone who had never had chickenpox or received chickenpox vaccine. 2. Recombinant shingles vaccine Recombinant shingles vaccine provides strong protection against shingles. Bypreventing shingles, recombinant shingles vaccine also protects against PHN. Recombinant shingles vaccine is the preferred vaccine for the prevention of shingles. However, a different vaccine, live shingles vaccine, may be used in somecircumstances. The recombinant shingles vaccine is recommended for adults 50 years and older without serious immune problems. It is given as a two-dose series. This vaccine is also recommended for people who have already gotten another type of shingles vaccine, the live shingles vaccine. There is no live virus inthis vaccine. Shingles vaccine may be given at the same time as other vaccines. 3. Talk with your health care provider Tell your vaccine provider if the person getting the vaccine: Has had an allergic reaction after a previous dose of recombinant shingles vaccine, or has any severe, life-threatening allergies. Is pregnant or breastfeeding. Is currently experiencing an episode of shingles. In some cases, your health care provider may decide to postpone shinglesvaccination to a future visit. People with minor illnesses, such as a cold, may be vaccinated. People who are moderately or severely ill should usually  wait until they recover beforegetting recombinant shingles vaccine. Your health care provider can give you more information. 4. Risks of a vaccine reaction A sore arm with  mild or moderate pain is very common after recombinant shingles vaccine, affecting about 80% of vaccinated people. Redness and swelling can also happen at the site of the injection. Tiredness, muscle pain, headache, shivering, fever, stomach pain, and nausea happen after vaccination in more than half of people who receive recombinant shingles vaccine. In clinical trials, about 1 out of 6 people who got recombinant zoster vaccine experienced side effects that prevented them from doing regular activities.Symptoms usually went away on their own in 2 to 3 days. You should still get the second dose of recombinant zoster vaccine even if youhad one of these reactions after the first dose. People sometimes faint after medical procedures, including vaccination. Tellyour provider if you feel dizzy or have vision changes or ringing in the ears. As with any medicine, there is a very remote chance of a vaccine causing asevere allergic reaction, other serious injury, or death. 5. What if there is a serious problem? An allergic reaction could occur after the vaccinated person leaves the clinic. If you see signs of a severe allergic reaction (hives, swelling of the face and throat, difficulty breathing, a fast heartbeat, dizziness, or weakness), call 9-1-1 and get the person to the nearest hospital. For other signs that concern you, call your health care provider. Adverse reactions should be reported to the Vaccine Adverse Event Reporting System (VAERS). Your health care provider will usually file this report, or you can do it yourself. Visit the VAERS website at www.vaers.[place].no or call 859 263 2642. VAERS is only for reporting reactions, and VAERS staff do not give medical advice. 6. How can I learn more? Ask your health care provider. Call your local or state health department. Contact the Centers for Disease Control and Prevention (CDC): Call 240-331-1113 (1-800-CDC-INFO) or Visit CDC's website at  Pic[employer].uy Vaccine Information Statement Recombinant Zoster Vaccine (06/12/2018) This information is not intended to replace advice given to you by your health care provider. Make sure you discuss any questions you have with your healthcare provider. Document Revised: 04/02/2020 Document Reviewed: 04/02/2020 Elsevier Patient Education  2022 [employer].

## 2021-02-25 ENCOUNTER — Other Ambulatory Visit: Payer: Self-pay | Admitting: Family Medicine

## 2021-03-06 NOTE — Progress Notes (Signed)
    SUBJECTIVE:   CHIEF COMPLAINT / HPI: Back pain  Patient reports that since running out of her Tylenol and muscle relaxer, she has been experiencing lower back pain bilaterally as well as tailbone pain and pain in her hips.  She reports that she still been taking the gabapentin and it does help with the stinging pains in her legs.  She describes the back pain as achy bones.  She denies any urinary or bladder incontinence.  Patient states that sometimes the pain makes it difficult for her to walk.  She states that she is currently continue to work with her case Production designer, theatre/television/film to enroll in insurance.  Patient will be open to completing physical therapy once she has been enrolled in insurance.  She reports that back pain is worse with rotating and flexing her back.  She reports that sometimes she is stiff when she wakes in the morning but other times she feels normal.  She denies any recent trauma.  PERTINENT  PMH / PSH:    OBJECTIVE:   BP 118/90   Pulse 98   Ht 5\' 6"  (1.676 m)   Wt 115 lb 9.6 oz (52.4 kg)   LMP 05/04/2013   SpO2 98%   BMI 18.66 kg/m   General: female appearing stated age in no acute distress  Back: No gross deformity, scoliosis. TTP in lumbar region bilaterally, reports tailbone tenderness.  No midline or bony TTP. Able to flex spine, extension is limited due to pain and patient waivers when doing this maneuver  Strength LEs 4/5 all muscle groups.   Sensation intact to light touch bilaterally.   ASSESSMENT/PLAN:   Lumbar back pain Chronic lumbar back pain, tailbone pain and bilateral hip pain. Discussed with patient that we will eventually need physical therapy and likely MRI of her back however pending patient is able to enroll in insurance to help with cost of these images and therapy.  For now, will refill Tylenol and baclofen as patient reports that these help.  I suspect patient has some component of osteoarthritis and has had old trauma that is contributing to  recurrence of pain intermittently. Patient to follow-up in 2 weeks     05/06/2013, MD [facility]

## 2021-03-07 ENCOUNTER — Encounter: Payer: Self-pay | Admitting: Family Medicine

## 2021-03-07 ENCOUNTER — Other Ambulatory Visit: Payer: Self-pay

## 2021-03-07 ENCOUNTER — Ambulatory Visit (INDEPENDENT_AMBULATORY_CARE_PROVIDER_SITE_OTHER): Payer: Self-pay | Admitting: Family Medicine

## 2021-03-07 DIAGNOSIS — M545 Low back pain, unspecified: Secondary | ICD-10-CM

## 2021-03-07 MED ORDER — BACLOFEN 10 MG PO TABS: 10.0000 mg | ORAL_TABLET | Freq: Three times a day (TID) | ORAL | 1 refills | Status: DC

## 2021-03-07 MED ORDER — ACETAMINOPHEN 500 MG PO TABS: 1000.0000 mg | ORAL_TABLET | Freq: Four times a day (QID) | ORAL | 1 refills | Status: DC | PRN

## 2021-03-07 NOTE — Patient Instructions (Addendum)
I will refill your Tylenol and muscle relaxer to help with your back pain.  Please plan to follow-up with me in 2 weeks.  I imagine that she will ultimately need more detailed imaging such as an MRI of your back in the near future, however we we will wait until you have been able to enroll in insurance to help with the cost of these images.  I would also like to eventually enroll you in physical therapy to help as well.  In the meantime, let us plan to complete a Pap smear in the next few weeks.   You are also due for COVID-vaccine, mammogram, and colonoscopy.

## 2021-03-07 NOTE — Assessment & Plan Note (Signed)
Chronic lumbar back pain, tailbone pain and bilateral hip pain. Discussed with patient that we will eventually need physical therapy and likely MRI of her back however pending patient is able to enroll in insurance to help with cost of these images and therapy.  For now, will refill Tylenol and baclofen as patient reports that these help.  I suspect patient has some component of osteoarthritis and has had old trauma that is contributing to recurrence of pain intermittently. Patient to follow-up in 2 weeks

## 2021-03-15 ENCOUNTER — Other Ambulatory Visit: Payer: Self-pay | Admitting: Family Medicine

## 2021-03-27 NOTE — Progress Notes (Signed)
    SUBJECTIVE:   CHIEF COMPLAINT / HPI: Back pain  Patient continues to report pain in the sacral region of her back.  She states that it is hard for her to work her part-time job due to the pain.  She reports the pain was worse yesterday and was 10/10 in severity when she woke up this morning.  She reports the pain is improved with baclofen and leg pain but usually burning in character, is improved with gabapentin.  Patient denies any fecal or urinary incontinence due to pain.  She states that she is able to ambulate independently but it is more painful when her pain is more severe.  Patient reports prior injuries of falling on her tailbone during a ski accident as well as years of dancing prior to that.  She is continues to work with social work to enroll in Financial controller so that she can have orthopedics referral as well as be set up with physical therapy.  PERTINENT  PMH / PSH:  Pyelonephritis  Bilateral leg pain    OBJECTIVE:   BP (!) 166/99   Pulse 89   Ht 5\' 6"  (1.676 m)   Wt 116 lb 9.6 oz (52.9 kg)   LMP 05/04/2013   SpO2 100%   BMI 18.82 kg/m   Back: No gross deformity, scoliosis. TTP in sacral region without additional areas of tenderness along vertebral column. Limited ROM however patient is able to flex, extend and rotate spinal column with some tenderness Strength LEs 4/5 all muscle groups.   Sensation intact bilaterally Negative SLRs.   ASSESSMENT/PLAN:   Lumbar back pain Patient continues to report pain in her tailbone area.  Previous x-rays have shown findings consistent with chronic arthritic changes in patient's setting of multiple injuries. Refill baclofen Refill gabapentin, increase to 200 mg at bedtime Refill sent for Tylenol Patient currently working with social work to enroll in 05/06/2013 as ultimately patient will need physical therapy and orthopedics referral     Financial controller, MD [facility]

## 2021-03-28 ENCOUNTER — Other Ambulatory Visit: Payer: Self-pay | Admitting: Family Medicine

## 2021-03-28 ENCOUNTER — Ambulatory Visit (INDEPENDENT_AMBULATORY_CARE_PROVIDER_SITE_OTHER): Payer: Self-pay | Admitting: Family Medicine

## 2021-03-28 ENCOUNTER — Other Ambulatory Visit: Payer: Self-pay

## 2021-03-28 ENCOUNTER — Encounter: Payer: Self-pay | Admitting: Family Medicine

## 2021-03-28 DIAGNOSIS — M545 Low back pain, unspecified: Secondary | ICD-10-CM

## 2021-03-28 MED ORDER — GABAPENTIN 100 MG PO CAPS: 200.0000 mg | ORAL_CAPSULE | Freq: Every day | ORAL | 1 refills | Status: DC

## 2021-03-28 MED ORDER — ALBUTEROL SULFATE HFA 108 (90 BASE) MCG/ACT IN AERS: 2.0000 | INHALATION_SPRAY | RESPIRATORY_TRACT | 6 refills | Status: DC | PRN

## 2021-03-28 MED ORDER — ACETAMINOPHEN 500 MG PO TABS: 500.0000 mg | ORAL_TABLET | Freq: Four times a day (QID) | ORAL | 3 refills | Status: DC | PRN

## 2021-03-28 NOTE — Patient Instructions (Addendum)
We have increased the gabapentin to 200mg  at bed time to help with the nerve pain in your legs   Please keep me posted on the status of your insurance enrollment so that we can work on getting you enrolled with physical therapy and referral to orthopedics.   I have sent prescriptions for your tylenol and baclofen as well.   Once you are feeling better, we will need to complete your pap smear.

## 2021-03-28 NOTE — Assessment & Plan Note (Signed)
Patient continues to report pain in her tailbone area.  Previous x-rays have shown findings consistent with chronic arthritic changes in patient's setting of multiple injuries. Refill baclofen Refill gabapentin, increase to 200 mg at bedtime Refill sent for Tylenol Patient currently working with social work to enroll in Financial controller as ultimately patient will need physical therapy and orthopedics referral

## 2021-03-29 ENCOUNTER — Telehealth: Payer: Self-pay | Admitting: *Deleted

## 2021-03-29 NOTE — Telephone Encounter (Signed)
   Telephone encounter was:  Successful.  03/29/2021 Name: Jeanne Huynh MRN: [id number] DOB: 03/13/1966  Jeanne Huynh is a 55 y.o. year old female who is a primary care patient of Simmons-Robinson, Makiera, MD . The community resource team was consulted for assistance with patient finally called back after muultiple attempst to reach her  Advised her to call health care marketplace she will not qualify for medicaid and is in [facility] guide performed the following interventions: .  Follow Up Plan:    Alois Cliche -[facility] Guide , Embedded Care Coordination [facility], Care Management  [phone] [address] , [place] [address] Email : [email or web address]

## 2021-04-04 ENCOUNTER — Other Ambulatory Visit: Payer: Self-pay | Admitting: Family Medicine

## 2021-04-15 ENCOUNTER — Other Ambulatory Visit: Payer: Self-pay | Admitting: Family Medicine

## 2021-04-25 ENCOUNTER — Other Ambulatory Visit: Payer: Self-pay | Admitting: Family Medicine

## 2021-04-26 NOTE — Progress Notes (Deleted)
    SUBJECTIVE:   CHIEF COMPLAINT / HPI: pap smear   Patient presents for pap smear. She denies any abnormal vaginal bleeding. She reports she is working with Psychiatric nurse in order to enroll in insurance as she does not qualify for medicaid at this time.  PERTINENT  PMH / PSH:  Bilateral Leg pain   OBJECTIVE:   LMP 05/04/2013   Genitalia:  *** Normal introitus for age, no external lesions, no vaginal discharge, mucosa pink and moist, no vaginal or cervical lesions, no vaginal atrophy, no friaility or hemorrhage, normal uterus size and position, no adnexal masses or tenderness   ASSESSMENT/PLAN:   No problem-specific Assessment & Plan notes found for this encounter.     Ronnald Ramp, MD [facility]

## 2021-04-27 ENCOUNTER — Ambulatory Visit: Payer: Self-pay | Admitting: Family Medicine

## 2021-05-06 ENCOUNTER — Other Ambulatory Visit: Payer: Self-pay | Admitting: Family Medicine

## 2021-05-30 ENCOUNTER — Ambulatory Visit: Payer: Self-pay | Admitting: Family Medicine

## 2021-05-30 ENCOUNTER — Other Ambulatory Visit: Payer: Self-pay | Admitting: Family Medicine

## 2021-06-13 ENCOUNTER — Other Ambulatory Visit: Payer: Self-pay | Admitting: Family Medicine

## 2021-06-22 NOTE — Progress Notes (Deleted)
    SUBJECTIVE:   CHIEF COMPLAINT / HPI: f/u for legs and pap smear   Leg Pain  Patient reports for follow up for bilateral leg pain. She reports using gabapentin and *** to help with pain.   HM  Patient presents for pap smear.   PERTINENT  PMH / PSH:  Bilateral leg pain   OBJECTIVE:   LMP 05/04/2013   ***  ASSESSMENT/PLAN:   No problem-specific Assessment & Plan notes found for this encounter.     Ronnald Ramp, MD [facility]

## 2021-06-23 ENCOUNTER — Other Ambulatory Visit: Payer: Self-pay | Admitting: Family Medicine

## 2021-06-23 ENCOUNTER — Ambulatory Visit: Payer: Self-pay | Admitting: Family Medicine

## 2021-07-11 ENCOUNTER — Other Ambulatory Visit: Payer: Self-pay | Admitting: Family Medicine

## 2021-07-19 ENCOUNTER — Ambulatory Visit: Payer: Self-pay | Admitting: Family Medicine

## 2021-07-23 ENCOUNTER — Other Ambulatory Visit: Payer: Self-pay | Admitting: Family Medicine

## 2021-07-26 ENCOUNTER — Other Ambulatory Visit: Payer: Self-pay

## 2021-07-26 ENCOUNTER — Encounter: Payer: Self-pay | Admitting: Family Medicine

## 2021-07-26 ENCOUNTER — Ambulatory Visit (INDEPENDENT_AMBULATORY_CARE_PROVIDER_SITE_OTHER): Payer: Self-pay | Admitting: Family Medicine

## 2021-07-26 ENCOUNTER — Other Ambulatory Visit (HOSPITAL_COMMUNITY)
Admission: RE | Admit: 2021-07-26 | Discharge: 2021-07-26 | Disposition: A | Discharge status: Home / Self Care | Payer: Self-pay | Source: Ambulatory Visit | Attending: Family Medicine | Admitting: Family Medicine

## 2021-07-26 VITALS — BP 161/95 | HR 91 | Wt 113.8 lb

## 2021-07-26 DIAGNOSIS — Z124 Encounter for screening for malignant neoplasm of cervix: Secondary | ICD-10-CM | POA: Insufficient documentation

## 2021-07-26 DIAGNOSIS — R030 Elevated blood-pressure reading, without diagnosis of hypertension: Secondary | ICD-10-CM

## 2021-07-26 NOTE — Patient Instructions (Signed)
Thank for coming in today.  Today we did your Pap smear and I will communicate results with you via MyChart when available.  Follow-up with your primary care doctor for your other chronic issues.  Dr. Salvadore Dom

## 2021-07-26 NOTE — Progress Notes (Addendum)
° ° °  SUBJECTIVE:   CHIEF COMPLAINT / HPI:   Ms. Boss is a 55 yo F who presents for the below.   Pap Smear  Prior paps: No prior documented Patient's last menstrual period was 05/04/2013. Sexually Active: yes  Desire for STD Screening: No  Last mammogram: Not documented, plans to schedule Concerns: None   PERTINENT  PMH / PSH: MSK pain  OBJECTIVE:   BP (!) 161/95    Pulse 91    Wt 113 lb 12.8 oz (51.6 kg)    LMP 05/04/2013    SpO2 100%    BMI 18.37 kg/m   General: Appears well, no acute distress. Age appropriate. Pelvic exam: normal external genitalia, vulva, vagina, cervix, uterus and adnexa. Neuro: alert and oriented Psych: normal affect  ASSESSMENT/PLAN:   1. Encounter for screening for cervical cancer Normal exam.  - f/u Cytology - PAP([place])  2. Elevated blood pressure reading Reviewed prior BP and previously normotensive. Will need to be addressed at follow up with PCP.   Lavonda Jumbo, DO [facility]

## 2021-07-29 LAB — CYTOLOGY - PAP
Comment: NEGATIVE
Diagnosis: NEGATIVE
High risk HPV: NEGATIVE

## 2021-08-09 ENCOUNTER — Other Ambulatory Visit: Payer: Self-pay | Admitting: Family Medicine

## 2021-08-21 ENCOUNTER — Other Ambulatory Visit: Payer: Self-pay | Admitting: Family Medicine

## 2021-09-05 ENCOUNTER — Other Ambulatory Visit: Payer: Self-pay | Admitting: Family Medicine

## 2021-09-11 ENCOUNTER — Other Ambulatory Visit: Payer: Self-pay | Admitting: Family Medicine

## 2021-09-24 ENCOUNTER — Other Ambulatory Visit: Payer: Self-pay | Admitting: Family Medicine

## 2021-10-09 ENCOUNTER — Other Ambulatory Visit: Payer: Self-pay | Admitting: Family Medicine

## 2021-10-10 ENCOUNTER — Other Ambulatory Visit: Payer: Self-pay | Admitting: Family Medicine

## 2021-10-31 ENCOUNTER — Ambulatory Visit: Payer: Self-pay | Admitting: Family Medicine

## 2021-10-31 NOTE — Progress Notes (Deleted)
? ? ?  SUBJECTIVE:  ? ?CHIEF COMPLAINT / HPI: f/u BP  ? ?Patient noted to have elevated BP at prior visit. Reports ***  ? ? ?PERTINENT  PMH / PSH:  ?Lumbar back pain  ?Bilateral leg pain  ? ? ?OBJECTIVE:  ? ?LMP 05/04/2013   ?Physical Exam ? ? ?ASSESSMENT/PLAN:  ? ?No problem-specific Assessment & Plan notes found for this encounter. ?  ? ? ?Ronnald Ramp, MD ?[facility]  ?

## 2021-11-20 NOTE — Progress Notes (Deleted)
? ? ?  SUBJECTIVE:  ? ?CHIEF COMPLAINT / HPI: leg pains ? ?*** ? ?PERTINENT  PMH / PSH: *** ? ?OBJECTIVE:  ? ?LMP 05/04/2013   ?*** ? ?ASSESSMENT/PLAN:  ? ?No problem-specific Assessment & Plan notes found for this encounter. ?  ? ? ?Ronnald Ramp, MD ?[facility]  ?

## 2021-11-21 ENCOUNTER — Ambulatory Visit: Payer: Self-pay | Admitting: Family Medicine

## 2021-12-22 NOTE — Progress Notes (Deleted)
    SUBJECTIVE:   CHIEF COMPLAINT / HPI: back and legs treatment   Back and Leg Pain, Chronic  ***   Elevated BP  ***   No insurance  Patient reports she continues to not have insurance coverage. This is negatively impacting the patient's health as she can not get services that she needs due to concern for out of pocket costs. [employer] team documentation reviewed and have not had successful contact with the patient. Advised to use market place for insurance as she does not qualify for medicaid and [place] in [place].   Health Maintenance Colonoscopy: needs pending insurance enrollment  Pap Smear: completed 07/2021, normal   Influenza Vaccine: no longer season  Covid Vaccine:recommended   TDAP: needed   HIV Screening: negative in 2022    PERTINENT  PMH / PSH:  HX of pyelonephritis  Bilateral leg pain    OBJECTIVE:   LMP 05/04/2013   Physical Exam   ASSESSMENT/PLAN:   No problem-specific Assessment & Plan notes found for this encounter.     Ronnald Ramp, MD [facility]

## 2021-12-23 ENCOUNTER — Ambulatory Visit: Payer: Self-pay | Admitting: Family Medicine

## 2022-01-07 ENCOUNTER — Other Ambulatory Visit: Payer: Self-pay | Admitting: Family Medicine

## 2022-01-17 ENCOUNTER — Encounter: Payer: Self-pay | Admitting: *Deleted

## 2022-02-06 ENCOUNTER — Ambulatory Visit: Payer: Self-pay | Admitting: Family Medicine

## 2022-02-06 NOTE — Progress Notes (Deleted)
    SUBJECTIVE:   CHIEF COMPLAINT / HPI: general check up   ***  PERTINENT  PMH / PSH: ***  OBJECTIVE:   LMP 05/04/2013   ***  ASSESSMENT/PLAN:   No problem-specific Assessment & Plan notes found for this encounter.     Ronnald Ramp, MD [facility]

## 2022-02-20 ENCOUNTER — Other Ambulatory Visit: Payer: Self-pay | Admitting: Family Medicine

## 2022-02-20 ENCOUNTER — Ambulatory Visit: Payer: Self-pay

## 2022-02-20 NOTE — Progress Notes (Deleted)
    SUBJECTIVE:   Chief compliant/HPI: annual examination  Jeanne Huynh is a 55 y.o. who presents today for an annual exam.    History tabs reviewed and updated ***.   Review of systems form reviewed and notable for ***.   OBJECTIVE:   LMP 05/04/2013   ***  ASSESSMENT/PLAN:   No problem-specific Assessment & Plan notes found for this encounter.    Annual Examination  See AVS for age appropriate recommendations  PHQ score ***, reviewed and discussed.  BP reviewed and at goal ***.  Asked about intimate partner violence and resources given as appropriate  Advance directives discussion ***  Considered the following items based upon USPSTF recommendations: Diabetes screening: {discussed/ordered:14545} Screening for elevated cholesterol: {discussed/ordered:14545} HIV testing: {discussed/ordered:14545} Hepatitis C: {discussed/ordered:14545} Hepatitis B: {discussed/ordered:14545} Syphilis if at high risk: {discussed/ordered:14545} GC/CT {GC/CT screening :23818} Osteoporosis screening considered based upon risk of fracture from [facility] calculator. Major osteoporotic fracture risk is ***%. DEXA {ordered not order:23822}.  Reviewed risk factors for latent tuberculosis and {not indicated/requested/declined:14582}   Discussed family history, BRCA testing {not indicated/requested/declined:14582}. Tool used to risk stratify was ***.  Cervical cancer screening: {PAPTYPE:23819} Breast cancer screening: {mammoscreen:23820} Colorectal cancer screening: {crcscreen:23821::"discussed, colonoscopy ordered"} Lung cancer screening: {discussed/declined/written info:19698}. See documentation below regarding indications/risks/benefits.  Vaccinations ***.   Follow up in 1 *** year or sooner if indicated.    Pearla Dubonnet, MD [place]

## 2022-02-27 ENCOUNTER — Ambulatory Visit: Payer: Self-pay | Admitting: *Deleted

## 2022-03-02 ENCOUNTER — Encounter: Payer: Self-pay | Admitting: Family Medicine

## 2022-03-02 NOTE — Progress Notes (Signed)
Patient has no-showed to multiple appointments in a 6-month period. Per our no-show policy, a letter has been routed to [facility] Admin to be mailed to patient regarding likely dismissal for repeat no show. Will CC to PCP.  ° °

## 2022-03-22 ENCOUNTER — Other Ambulatory Visit: Payer: Self-pay

## 2022-03-22 ENCOUNTER — Ambulatory Visit (HOSPITAL_COMMUNITY): Payer: Self-pay

## 2022-03-22 ENCOUNTER — Ambulatory Visit
Admission: RE | Admit: 2022-03-22 | Discharge: 2022-03-22 | Disposition: A | Discharge status: Home / Self Care | Payer: Self-pay | Source: Ambulatory Visit | Attending: Family Medicine | Admitting: Family Medicine

## 2022-03-22 VITALS — BP 156/90 | HR 83 | Temp 98.5°F | Resp 18

## 2022-03-22 DIAGNOSIS — J029 Acute pharyngitis, unspecified: Secondary | ICD-10-CM

## 2022-03-22 DIAGNOSIS — J0100 Acute maxillary sinusitis, unspecified: Secondary | ICD-10-CM

## 2022-03-22 DIAGNOSIS — R051 Acute cough: Secondary | ICD-10-CM

## 2022-03-22 MED ORDER — PREDNISONE 20 MG PO TABS: 40.0000 mg | ORAL_TABLET | Freq: Every day | ORAL | 0 refills | Status: DC

## 2022-03-22 MED ORDER — HYDROCODONE BIT-HOMATROP MBR 5-1.5 MG/5ML PO SOLN: 5.0000 mL | Freq: Four times a day (QID) | ORAL | 0 refills | Status: DC | PRN

## 2022-03-22 MED ORDER — LIDOCAINE VISCOUS HCL 2 % MT SOLN: 5.0000 mL | Freq: Four times a day (QID) | OROMUCOSAL | 0 refills | Status: DC | PRN

## 2022-03-22 MED ORDER — DOXYCYCLINE HYCLATE 100 MG PO CAPS: 100.0000 mg | ORAL_CAPSULE | Freq: Two times a day (BID) | ORAL | 0 refills | Status: DC

## 2022-03-22 NOTE — ED Provider Notes (Signed)
[facility]   [id number] 03/22/22 Arrival Time: 1652  ASSESSMENT & PLAN:  1. Acute non-recurrent maxillary sinusitis   2. Acute cough   3. Sore throat    No resp distress. OTC as needed.  Meds ordered this encounter  Medications   HYDROcodone bit-homatropine (HYCODAN) 5-1.5 MG/5ML syrup    Sig: Take 5 mLs by mouth every 6 (six) hours as needed for cough.    Dispense:  90 mL    Refill:  0   predniSONE (DELTASONE) 20 MG tablet    Sig: Take 2 tablets (40 mg total) by mouth daily.    Dispense:  10 tablet    Refill:  0   magic mouthwash (lidocaine, diphenhydrAMINE, alum & mag hydroxide) suspension    Sig: Swish and spit 5 mLs 4 (four) times daily as needed for mouth pain.    Dispense:  360 mL    Refill:  0   doxycycline (VIBRAMYCIN) 100 MG capsule    Sig: Take 1 capsule (100 mg total) by mouth 2 (two) times daily.    Dispense:  20 capsule    Refill:  0   Work note provided. Discussed typical duration of symptoms. OTC symptom care as needed. Ensure adequate fluid intake and rest.  [place] Controlled Substances Registry consulted for this patient. I feel the risk/benefit ratio today is favorable for proceeding with this prescription for a controlled substance. Medication sedation precautions given.     Reviewed expectations re: course of current medical issues. Questions answered. Outlined signs and symptoms indicating need for more acute intervention. Patient verbalized understanding. After Visit Summary given.   SUBJECTIVE: History from: patient. Jeanne Huynh is a 56 y.o. female who presents with complaint of nasal congestion, post-nasal drainage, and sinus pain. Onset gradual,  approx 10 d ago . Respiratory symptoms: dry cough; does affect sleep. Fever: absent. Overall normal PO intake without n/v. OTC treatment: various without relief. Seasonal allergies: denied. History of frequent sinus infections: no. No specific aggravating or alleviating factors  reported. Social History   Tobacco Use  Smoking Status Some Days  Smokeless Tobacco Never    ROS: As per HPI.  OBJECTIVE:  Vitals:   03/22/22 1714  BP: (!) 156/90  Pulse: 83  Resp: 18  Temp: 98.5 F (36.9 C)  TempSrc: Oral  SpO2: 98%     General appearance: alert; no distress HEENT: nasal congestion; clear runny nose; throat irritation secondary to post-nasal drainage; bilateral maxillary tenderness to palpation; turbinates boggy Neck: supple without LAD; trachea midline Lungs: unlabored respirations, symmetrical air entry; cough: moderate and dry; no respiratory distress Skin: warm and dry Psychological: alert and cooperative; normal mood and affect  Allergies  Allergen Reactions   Aspirin Other (See Comments)    "made me have a asthma attack"   Codeine Nausea Only   Motrin [Ibuprofen] Nausea Only    Cant take high amounts   Penicillins Other (See Comments)    "lost 10 pounds while taking it" Has patient had a PCN reaction causing immediate rash, facial/tongue/throat swelling, SOB or lightheadedness with hypotension: No Has patient had a PCN reaction causing severe rash involving mucus membranes or skin necrosis: No Has patient had a PCN reaction that required hospitalization No Has patient had a PCN reaction occurring within the last 10 years: No If all of the above answers are "NO", then may proceed with Cephalosporin use.     Past Medical History:  Diagnosis Date   Asthma    Enlarged kidney  Kidney stone    Family History  Problem Relation Age of Onset   Cancer Mother    Heart failure Father    Social History   Socioeconomic History   Marital status: Married    Spouse name: Not on file   Number of children: Not on file   Years of education: Not on file   Highest education level: Not on file  Occupational History   Not on file  Tobacco Use   Smoking status: Some Days   Smokeless tobacco: Never  Substance and Sexual Activity   Alcohol use:  Yes   Drug use: Not on file   Sexual activity: Yes  Other Topics Concern   Not on file  Social History Narrative   Not on file   Social Determinants of Health   Financial Resource Strain: Not on file  Food Insecurity: Not on file  Transportation Needs: Not on file  Physical Activity: Not on file  Stress: Not on file  Social Connections: Not on file  Intimate Partner Violence: Not on file             Mardella Layman, MD 03/22/22 [phone]

## 2022-03-22 NOTE — Discharge Instructions (Signed)
Be aware, your cough medication may cause drowsiness. Please do not drive, operate heavy machinery or make important decisions while on this medication, it can cloud your judgement.  

## 2022-03-22 NOTE — ED Triage Notes (Addendum)
Pt reports cough, sore throat, headache since last Sunday. Pt reports recently lost voice and reports "bump" to inside of mouth, lips, and throat.  Pt reports employer recently returned from [place] and reports they had something similar upon returning home.

## 2022-03-23 ENCOUNTER — Telehealth: Payer: Self-pay

## 2022-03-23 MED ORDER — LIDOCAINE VISCOUS HCL 2 % MT SOLN: 5.0000 mL | Freq: Four times a day (QID) | OROMUCOSAL | 0 refills | Status: DC | PRN

## 2022-04-18 ENCOUNTER — Ambulatory Visit: Payer: Self-pay | Admitting: Student

## 2022-04-18 NOTE — Progress Notes (Deleted)
  SUBJECTIVE:   CHIEF COMPLAINT / HPI:   HTN BP Readings from Last 3 Encounters:  03/22/22 (!) 156/90  07/26/21 (!) 161/95  03/28/21 (!) 166/99  Meds: None   HLD Not checked  DM A1c not checked   Health Mait: Pap - 07/26/21 normal, repeat in 5 years 2027 Colonoscopy - Needed Mammo - last in 2001, needed  PERTINENT  PMH / PSH: ***  Past Medical History:  Diagnosis Date   Asthma    Enlarged kidney    Kidney stone     No past surgical history on file.  OBJECTIVE:  LMP 05/04/2013   General: NAD, pleasant, able to participate in exam Cardiac: RRR, no murmurs auscultated. Respiratory: CTAB, normal effort, no wheezes, rales or rhonchi Abdomen: soft, non-tender, non-distended, normoactive bowel sounds Extremities: warm and well perfused, no edema or cyanosis. Skin: warm and dry, no rashes noted Neuro: alert, no obvious focal deficits, speech normal Psych: Normal affect and mood  ASSESSMENT/PLAN:  No problem-specific Assessment & Plan notes found for this encounter.   No orders of the defined types were placed in this encounter.  No orders of the defined types were placed in this encounter.  No follow-ups on file. @SIGNNOTE @ {    This will disappear when note is signed, click to select method of visit    :1}

## 2022-04-25 ENCOUNTER — Other Ambulatory Visit: Payer: Self-pay | Admitting: Family Medicine

## 2022-04-26 NOTE — Telephone Encounter (Signed)
Jeanne Huynh!  No prob at all! Can you tell me what the baclofen was for? It looks like she had some back pain in 8/22. Do you think she should stay on this long term?

## 2022-05-09 ENCOUNTER — Ambulatory Visit: Payer: Self-pay | Admitting: Student

## 2022-05-09 NOTE — Progress Notes (Deleted)
  SUBJECTIVE:   CHIEF COMPLAINT / HPI:     Annual Examination  PHQ score ***, reviewed and discussed.  BP reviewed and at goal ***.  Asked about intimate partner violence and resources given as appropriate   Considered the following items based upon USPSTF recommendations: Diabetes screening: {discussed/ordered:14545} Screening for elevated cholesterol: {discussed/ordered:14545} HIV testing: discussed - neg 12/23/20 Hepatitis C: discussed - neg 12/23/20 Hepatitis B: {discussed/ordered:14545} Syphilis if at high risk: {discussed/ordered:14545} GC/CT {GC/CT screening :23818} Osteoporosis screening considered based upon risk of fracture from [facility] calculator. Major osteoporotic fracture risk is ***%. DEXA {ordered not order:23822}.    Discussed family history, BRCA testing {not indicated/requested/declined:14582}. Tool used to risk stratify was ***.  Cervical cancer screening: prior Pap reviewed, repeat due in 07/2026 Breast cancer screening: {mammoscreen:23820} Colorectal cancer screening: {crcscreen:23821::"discussed, colonoscopy ordered"} Lung cancer screening: {discussed/declined/written JWWZ:92780}. See documentation below regarding indications/risks/benefits.  Vaccinations ***.   PERTINENT  PMH / PSH: Asthma  OBJECTIVE:  LMP 05/04/2013   General: NAD, pleasant, able to participate in exam Cardiac: RRR, no murmurs auscultated. Respiratory: CTAB, normal effort, no wheezes, rales or rhonchi Abdomen: soft, non-tender, non-distended, normoactive bowel sounds Extremities: warm and well perfused, no edema or cyanosis. Skin: warm and dry, no rashes noted Neuro: alert, no obvious focal deficits, speech normal Psych: Normal affect and mood  ASSESSMENT/PLAN:  No problem-specific Assessment & Plan notes found for this encounter.   No orders of the defined types were placed in this encounter.  No orders of the defined types were placed in this encounter.  No follow-ups on  file. _0 @ {    This will disappear when note is signed, click to select method of visit    :1}

## 2022-06-26 ENCOUNTER — Other Ambulatory Visit: Payer: Self-pay | Admitting: Student

## 2022-07-31 ENCOUNTER — Ambulatory Visit: Payer: Self-pay | Admitting: Student

## 2022-08-04 ENCOUNTER — Other Ambulatory Visit: Payer: Self-pay | Admitting: Student

## 2022-09-26 IMAGING — CT CT ANGIO CHEST
2 of 6 series · 19 of 46 positions shown · IV contrast (omnipaque)
Comparison: Radiograph same day

CLINICAL DATA: Shortness of breath D-dimer positive

EXAM:
CT ANGIOGRAPHY CHEST WITH CONTRAST
TECHNIQUE: Multidetector CT imaging of the chest was performed using the
standard protocol during bolus administration of intravenous
contrast. Multiplanar CT image reconstructions and MIPs were
obtained to evaluate the vascular anatomy.
CONTRAST:  75mL OMNIPAQUE IOHEXOL 350 MG/ML SOLN

[Series 7: thins · axial · 0.55mm/px · z∈[+38,+303]mm · 16 of 291 slices shown]
[im 13/291  lung]
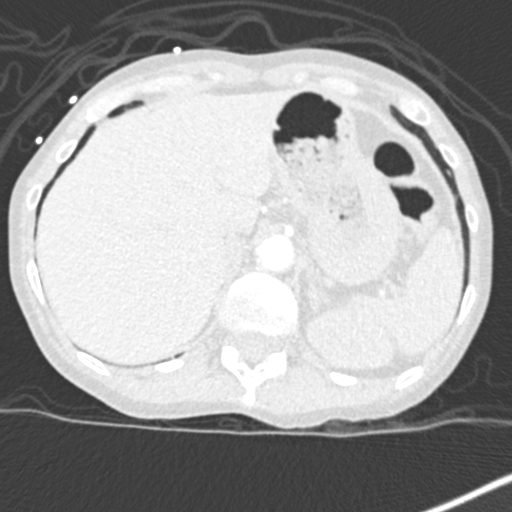
[im 38/291  soft-tissue]
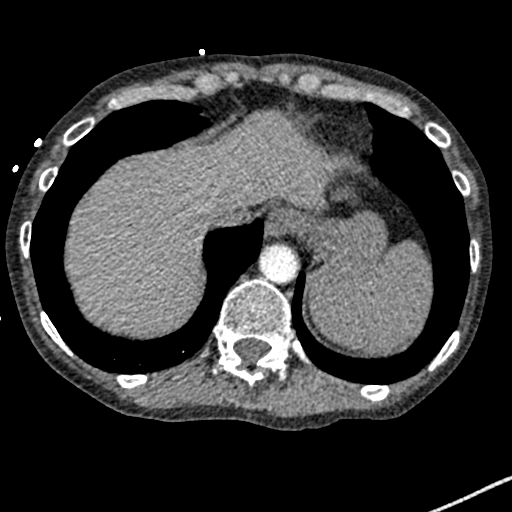
[im 51/291  lung]
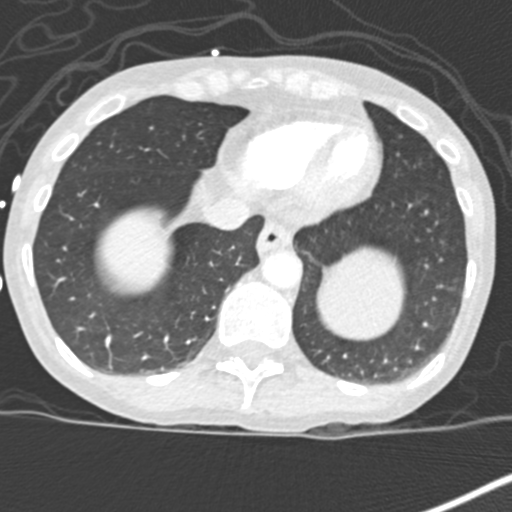
[im 64/291  soft-tissue]
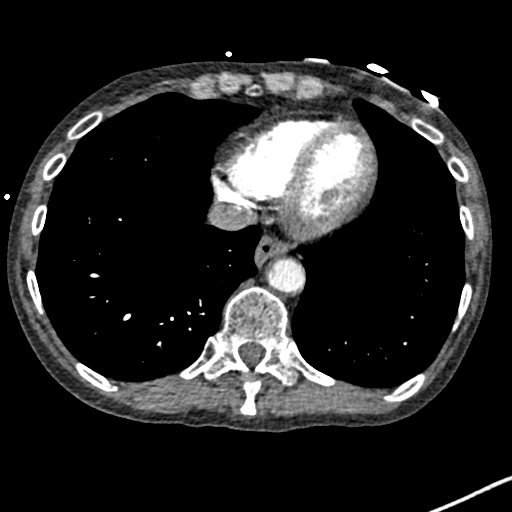
[im 89/291  lung]
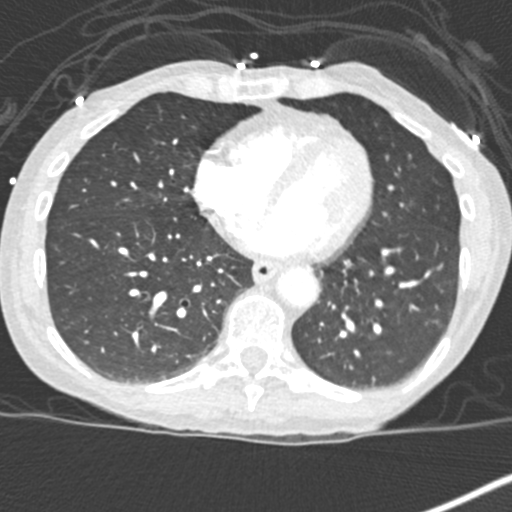
[im 101/291  soft-tissue]
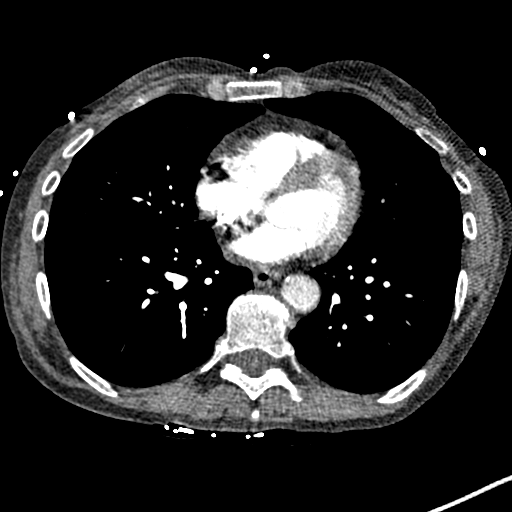
[im 114/291  lung]
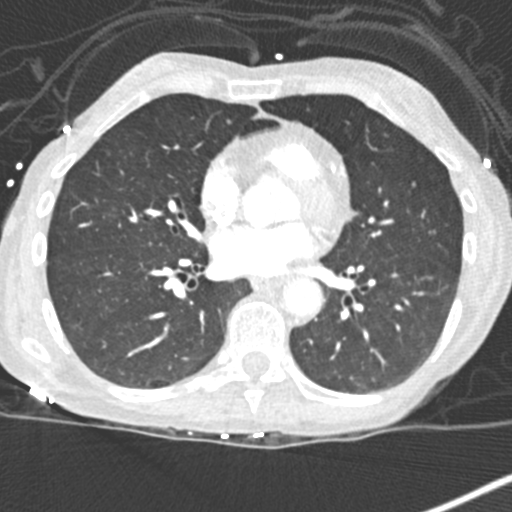
[im 139/291  soft-tissue]
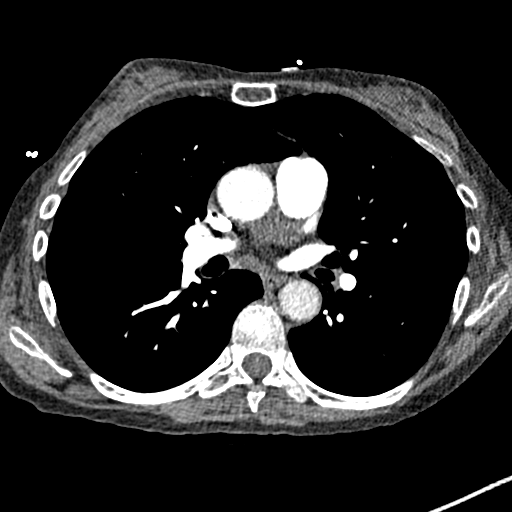
[im 152/291  lung]
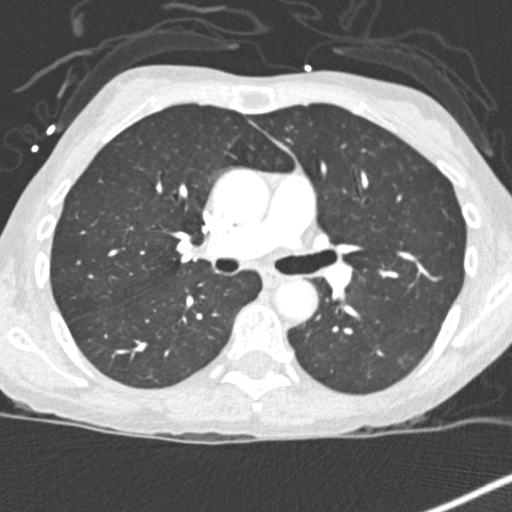
[im 177/291  soft-tissue]
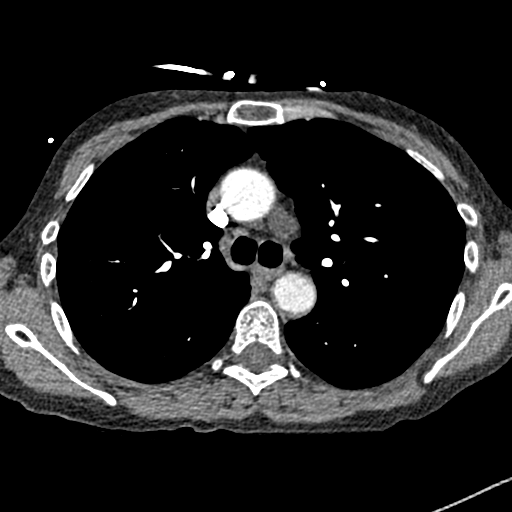
[im 190/291  lung]
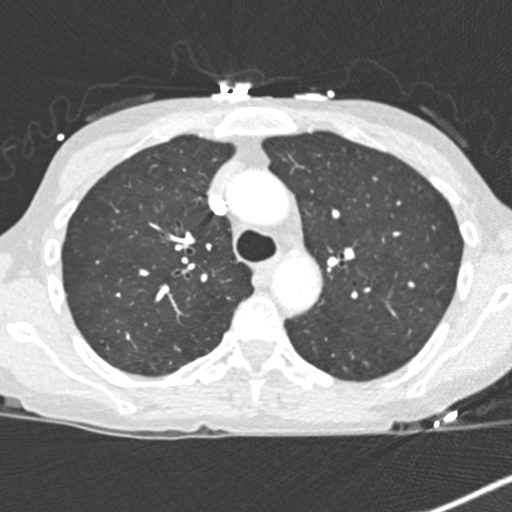
[im 202/291  soft-tissue]
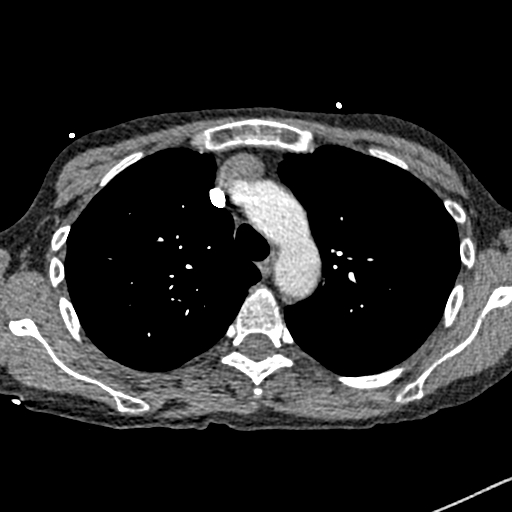
[im 227/291  lung]
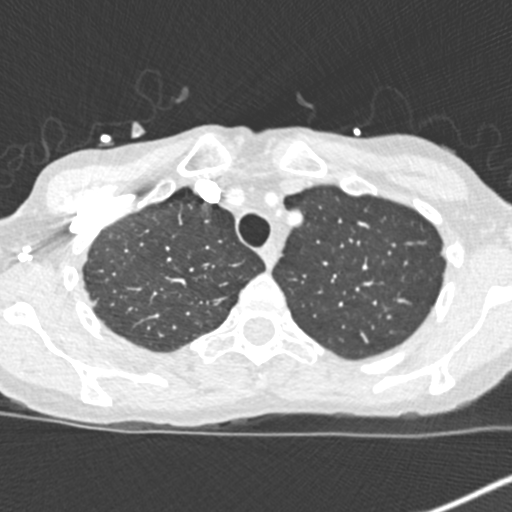
[im 240/291  soft-tissue]
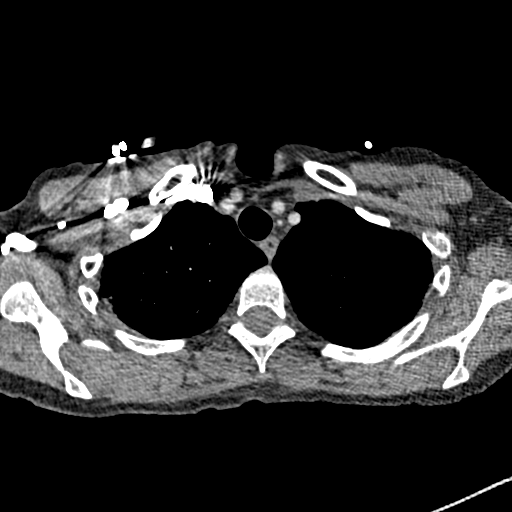
[im 253/291  lung]
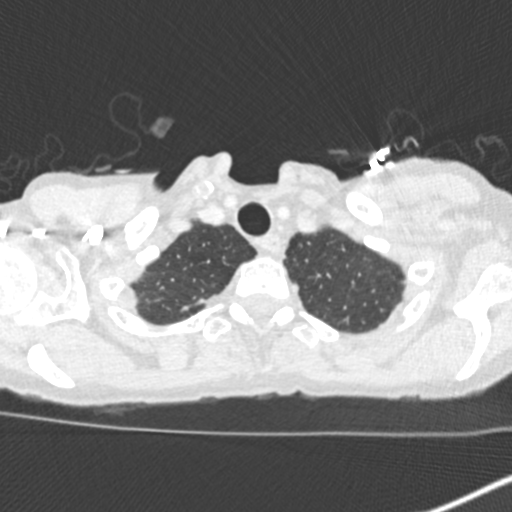
[im 278/291  soft-tissue]
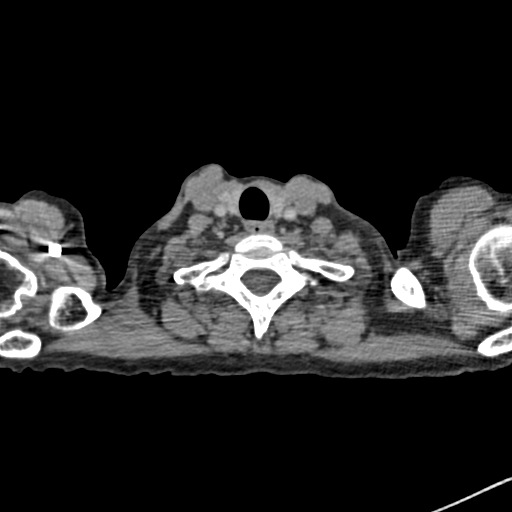

[Series 9: coronal mpr · coronal · 0.59mm/px · 3 of 106 slices shown]
[im 27/106  soft-tissue]
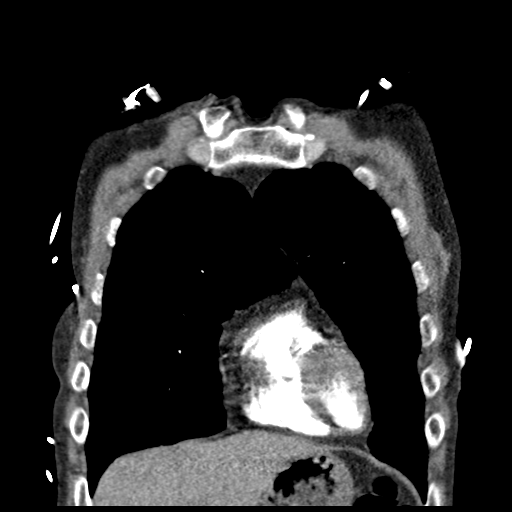
[im 53/106  soft-tissue]
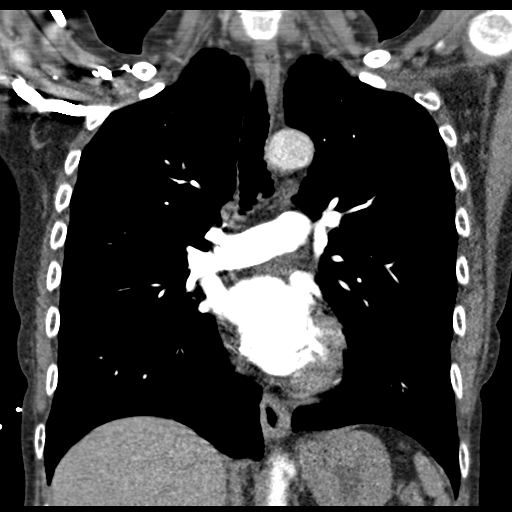
[im 79/106  soft-tissue]
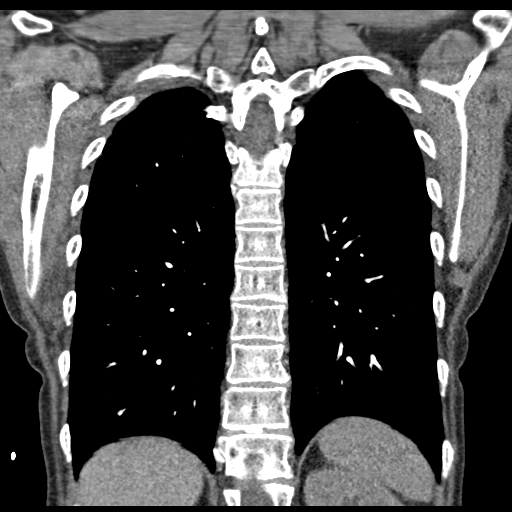

[19 of 46 positions shown; findings below may reference images not displayed]

FINDINGS: Cardiovascular: There is a optimal opacification of the pulmonary
arteries. There is no central,segmental, or subsegmental filling
defects within the pulmonary arteries. The heart is normal in size.
No pericardial effusion or thickening. No evidence right heart
strain. There is normal three-vessel brachiocephalic anatomy without
proximal stenosis. Scattered aortic atherosclerosis is seen.
Coronary artery calcifications are noted.

Mediastinum/Nodes: No hilar, mediastinal, or axillary adenopathy.
Thyroid gland, trachea, and esophagus demonstrate no significant
findings.

Lungs/Pleura: The lungs are clear. No pleural effusion or
pneumothorax. No airspace consolidation.

Upper Abdomen: No acute abnormalities present in the visualized
portions of the upper abdomen.

Musculoskeletal: No chest wall abnormality. No acute or significant
osseous findings.

Review of the MIP images confirms the above findings.
IMPRESSION: No central, segmental, or subsegmental pulmonary embolism

No acute intrathoracic pathology

## 2022-10-09 ENCOUNTER — Other Ambulatory Visit: Payer: Self-pay | Admitting: Student

## 2022-10-16 ENCOUNTER — Ambulatory Visit: Payer: Self-pay | Admitting: Student

## 2022-10-16 NOTE — Progress Notes (Deleted)
  SUBJECTIVE:   CHIEF COMPLAINT / HPI:   F/u HTN? Meds: none BP Readings from Last 3 Encounters:  03/22/22 (!) 156/90  07/26/21 (!) 161/95  03/28/21 (!) 166/99     Health Mait: Mammogram Colonoscopy Pap: Next due 2027  PERTINENT  PMH / PSH: ***  Past Medical History:  Diagnosis Date   Asthma    Enlarged kidney    Kidney stone     OBJECTIVE:  LMP 05/04/2013  Physical Exam   ASSESSMENT/PLAN:  There are no diagnoses linked to this encounter. No follow-ups on file. Holley Bouche, MD 10/16/2022, 11:52 AM PGY-***, [facility] {    This will disappear when note is signed, click to select method of visit    :1}

## 2022-11-08 ENCOUNTER — Other Ambulatory Visit: Payer: Self-pay | Admitting: Student

## 2022-12-19 ENCOUNTER — Encounter: Payer: Self-pay | Admitting: Student

## 2022-12-19 ENCOUNTER — Ambulatory Visit (INDEPENDENT_AMBULATORY_CARE_PROVIDER_SITE_OTHER): Payer: Self-pay | Admitting: Student

## 2022-12-19 VITALS — BP 128/77 | HR 96 | Wt 104.0 lb

## 2022-12-19 DIAGNOSIS — Z131 Encounter for screening for diabetes mellitus: Secondary | ICD-10-CM

## 2022-12-19 DIAGNOSIS — Z1159 Encounter for screening for other viral diseases: Secondary | ICD-10-CM

## 2022-12-19 DIAGNOSIS — Z13220 Encounter for screening for lipoid disorders: Secondary | ICD-10-CM

## 2022-12-19 DIAGNOSIS — R634 Abnormal weight loss: Secondary | ICD-10-CM

## 2022-12-19 DIAGNOSIS — M545 Low back pain, unspecified: Secondary | ICD-10-CM

## 2022-12-19 DIAGNOSIS — Z1231 Encounter for screening mammogram for malignant neoplasm of breast: Secondary | ICD-10-CM

## 2022-12-19 DIAGNOSIS — Z1211 Encounter for screening for malignant neoplasm of colon: Secondary | ICD-10-CM

## 2022-12-19 DIAGNOSIS — F419 Anxiety disorder, unspecified: Secondary | ICD-10-CM

## 2022-12-19 NOTE — Patient Instructions (Signed)
It was great to see you! Thank you for allowing me to participate in your care!  I recommend that you always bring your medications to each appointment as this makes it easy to ensure we are on the correct medications and helps [place] not miss when refills are needed.  Our plans for today:  -Anxiety Reach out to you Psychiatrist as soon as possible to see about increasing dose of meds and adjusting meds. We want to get you some help for all the stressors and anxiety you've been having  Follow up with Psychiatrist, as soon as possible  Follow up with Therapist (list below)  Make an appointment for follow with me in 2 weeks  - Lab testing Checking for Hepatitis B, High Cholesterol, Syphilis  - Screenings Due to consider in future  Mammogram - looking for breast cancer  Colonoscopy - looking for colon cancer  Gonorrhea and Chlamydia - looking for sexually transmitted diseases  CT scan of chest - screening for lung cancer  - Unexpected Weight loss Schedule an appointment with our Dietician, Dr. Wyona Almas. She will discuss what can be done to help you gain some weight back  Dr. Wyona Almas  [phone]  - Smoking  Can discuss trying to quit in the future, if interested.  - Back/Leg pain  Will follow up at next visit and see how best to address  We are checking some labs today, I will call you if they are abnormal will send you a MyChart message or a letter if they are normal.  If you do not hear about your labs in the next 2 weeks please let us know.  Take care and seek immediate care sooner if you develop any concerns.   Dr. Bess Kinds, MD [facility] Family Medicine   Outpatient Mental Health Providers (No Insurance required or Self Pay)  [facility]  [phone] [email or web address] Marcy Panning and [facility]  [address] [place], [place] [phone] Crisis  [phone]  [facility]) can see uninsured folks for outpatient therapy [email or web address] [address] [place], [place] [address] [phone]  [facility]    Walk-in Mon-Fri, 8am-3pm [email or web address] [address] [place], [place]  [phone]   [address]  [place] [phone] [facility] for psych med management, there may be a wait- if [facility] is working with clients for OPT, they will coordinate with [facility] for psych  [facility]   Walk-in-Clinic: Monday- Friday 9:00 AM - 4:00 PM [address]   [place], [place] [phone]  [facility] [place] ([employer]) walk in M-F 8am-12pm and  1pm-3pm [place]- [address]     [phone]  [employer] -[address]  Phone: [phone]  [employer] (Mental Health and substance challenges) [address]   [place] [phone]    [email or web address]    [address]  [employer] [facility] [place]  [facility] -[address], [place]     Phone:  [phone] [facility]-  [address]  [phone]    [facility] ( virtual or zoom therapy) [email or web address]  [address] [place]  [phone]    [facility] [phone]  grief counseling, dementia and caregiver support    [facility] Walk-in MWF 12:30 to 3:00     [address] [place]  [address] [place] [address]  [phone]  [email or web address] call to schedule an appointment     National Alliance on Mental Illness (NAMI) [place]- Wellness classes, Support groups        [address], [place], [place] [address] [phone]   [email or web address]   [facility]  (Psycho-social Rehabilitation clubhouse, Individual and group therapy) [address] [place], [place] [address]   [phone]  24- Hour Availability:  [facility] [phone] or [phone] * Family Service  of the [employer] (Domestic Violence, Rape, etc. )[phone] Vesta Mixer [phone] or [phone] * [facility] [phone] only) [phone] (after hours) *Therapeutic Alternative Mobile Crisis Unit [phone] *[place] National Suicide Hotline [phone] Len Childs)
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
## 2022-12-20 ENCOUNTER — Encounter: Payer: Self-pay | Admitting: Student

## 2022-12-20 DIAGNOSIS — F419 Anxiety disorder, unspecified: Secondary | ICD-10-CM | POA: Insufficient documentation

## 2022-12-20 DIAGNOSIS — R634 Abnormal weight loss: Secondary | ICD-10-CM | POA: Insufficient documentation

## 2022-12-20 LAB — LIPID PANEL
Chol/HDL Ratio: 2.8 ratio (ref 0.0–4.4)
Cholesterol, Total: 165 mg/dL (ref 100–199)
HDL: 58 mg/dL (ref >39)
LDL Chol Calc (NIH): 89 mg/dL (ref 0–99)
Triglycerides: 98 mg/dL (ref 0–149)
VLDL Cholesterol Cal: 18 mg/dL (ref 5–40)

## 2022-12-20 LAB — HEPATITIS B SURFACE ANTIGEN: Hepatitis B Surface Ag: NEGATIVE

## 2022-12-20 NOTE — Assessment & Plan Note (Signed)
Reports mood has been good, other than some mood swing infrequently/short lived, believed to be related to menopause. But notes having higher level of anxiety because son has started using heroin. She became teary eyed in the office and notes that her anxiety isn't well controlled now. Patient follows w/ a psychiatrist. Will have her f/u w/ psych and offer therapy resources.   -F/u w/ Psychiatrist about med change -Therapy resources given -F/u 2 weeks
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
## 2022-12-20 NOTE — Assessment & Plan Note (Signed)
Patient has lost 10 pounds from 07/26/21 to 12/19/22, unintentionally. She reports eating good balanced meals regularly, x 3 a day. Notes that she has bisc[place]le in the morning, a sandwich and chips for lunch, and meat w/ 2 veggies and carbs for dinner. She does note higher degree of stress/anxiety than normal. Patient BMI dropped from 18 to 16 and is concerning. Will have patient f/u w/ RD Dr. Gerilyn Pilgrim.  -Ref Dr. Gerilyn Pilgrim -F/u 2 weeks

## 2022-12-21 ENCOUNTER — Other Ambulatory Visit: Payer: Self-pay | Admitting: Student

## 2023-01-09 ENCOUNTER — Other Ambulatory Visit: Payer: Self-pay

## 2023-01-09 ENCOUNTER — Other Ambulatory Visit (HOSPITAL_COMMUNITY)
Admission: RE | Admit: 2023-01-09 | Discharge: 2023-01-09 | Disposition: A | Discharge status: Home / Self Care | Payer: Self-pay | Source: Ambulatory Visit | Attending: Family Medicine | Admitting: Family Medicine

## 2023-01-09 ENCOUNTER — Ambulatory Visit (INDEPENDENT_AMBULATORY_CARE_PROVIDER_SITE_OTHER): Payer: Self-pay | Admitting: Student

## 2023-01-09 ENCOUNTER — Encounter: Payer: Self-pay | Admitting: Student

## 2023-01-09 ENCOUNTER — Telehealth: Payer: Self-pay

## 2023-01-09 VITALS — BP 128/74 | HR 79 | Ht 65.0 in | Wt 103.2 lb

## 2023-01-09 DIAGNOSIS — Z122 Encounter for screening for malignant neoplasm of respiratory organs: Secondary | ICD-10-CM

## 2023-01-09 DIAGNOSIS — Z1231 Encounter for screening mammogram for malignant neoplasm of breast: Secondary | ICD-10-CM

## 2023-01-09 DIAGNOSIS — Z720 Tobacco use: Secondary | ICD-10-CM

## 2023-01-09 DIAGNOSIS — Z113 Encounter for screening for infections with a predominantly sexual mode of transmission: Secondary | ICD-10-CM

## 2023-01-09 DIAGNOSIS — Z0000 Encounter for general adult medical examination without abnormal findings: Secondary | ICD-10-CM | POA: Insufficient documentation

## 2023-01-09 DIAGNOSIS — F419 Anxiety disorder, unspecified: Secondary | ICD-10-CM

## 2023-01-09 DIAGNOSIS — R634 Abnormal weight loss: Secondary | ICD-10-CM

## 2023-01-09 NOTE — Telephone Encounter (Signed)
Called patient to inform her I scheduled her CT for 02/05/23 at 3:00pm with [facility], she is now aware. Penni Bombard CMA

## 2023-01-09 NOTE — Assessment & Plan Note (Signed)
Patient reports anxiety has improved since seeing her psychiatrist.  Patient notes that she was told to take her medicine in the morning as opposed to the evenings.  This has made a difference in her anxiety level and seems to help.  Patient notes she still has periods of tearfulness.  Through shared decision making patient's decided she would like to speak with a therapist.  Therapy resources provided. - Finding follow up with therapist weekly - Therapy resources given

## 2023-01-09 NOTE — Assessment & Plan Note (Signed)
Patient wanting routine screening for sexually transmitted infections.  Patient denies any discharge, burning, or itching.  Patient has had same partner for years and does not use protection. - HIV/syphilis/BV/trichomoniasis/GC/CT

## 2023-01-09 NOTE — Assessment & Plan Note (Addendum)
Patient with 10 pound weight loss over 2 years unintentionally.  Patient had negative Pap smear, making cervical cancer unlikely.  Patient is due for colonoscopy, mammogram, and low-dose CT.  Patient agreeable to mammogram and low-dose CT today.  Patient plans to schedule appointment with clinic dietitian Dr. Wyona Almas.  Some concern that weight loss could be coming from potential malignancy, given patient is due for several screenings.  Will attempt to collect screenings, as patient is willing, and will also refer to clinic dietitian for counseling on how to gain weight.  Patient notes in last visit that she gets 3 balanced meals a day, and is unsure why she has lost weight. - Schedule appointment with Dr. Gerilyn Pilgrim - Consider colonoscopy/FIT testing

## 2023-01-09 NOTE — Patient Instructions (Addendum)
It was great to see you! Thank you for allowing me to participate in your care!  Our plans for today:  - Screening for   Breast Cancer Mammogram [facility] Address: [address], [place], [place] [address]  Phone: [phone]   Lung Cancer CT scan of Lungs - Weight Loss Follow up with [place]  Dr. Wyona Almas             [phone]  -Anxiety  Therapy resources below. Find a therapist and start seeing them weekly, until things get easier/more steady.   -STI screening Testing for Gonorrhea, Chlamydia, Trichomonas, Syphilis, and yeast  -Tobacco Cessation  Make an appointment with Dr. Madelon Lips to discuss quitting smoking.   We are checking some labs today, I will call you if they are abnormal will send you a MyChart message or a letter if they are normal.  If you do not hear about your labs in the next 2 weeks please let us know.  Take care and seek immediate care sooner if you develop any concerns.   Dr. Bess Kinds, MD [facility] Family Medicine    Outpatient Mental Health Providers (No Insurance required or Self Pay)  [facility]  [phone] [email or web address] Marcy Panning and [facility]  [address] [place] [phone] Crisis [phone]  [facility]) can see uninsured folks for outpatient therapy [email or web address] [address] [place], [place] [address] [phone]  [facility]    Walk-in Mon-Fri, 8am-3pm [email or web address] [address] [place], [place]  [phone]   [address]   [phone] [facility] for psych med management, there may be a wait- if [facility] is working with clients for OPT, they will coordinate with [facility] for psych  [facility]   Walk-in-Clinic: Monday- Friday 9:00 AM - 4:00 PM [address]   [place], [place] [phone]  [phone]  [facility] [place] ([employer]) walk in M-F 8am-12pm and  1pm-3pm [place]- [address]     [phone]  [employer] -[address]  Phone: [phone]  [employer] (Mental Health and substance challenges) [address]   [place] [phone]    [email or web address]    [address]  [employer] [facility]  [facility] -[address], [place]     Phone:  [phone] [facility]-  [address]  [phone]    [facility] ( virtual or zoom therapy) [email or web address]  [address] [place]  [phone]    [facility] [phone]  grief counseling, dementia and caregiver support    [facility] Drug Services Walk-in MWF 12:30 to 3:00     [address] [place] [address]  [phone]  [email or web address] call to schedule an appointment     National Alliance on Mental Illness (NAMI) [place]- Wellness classes, Support groups        [address] [place], [place] [address] [phone]   [email or web address]   [facility]  (Psycho-social Rehabilitation clubhouse, Individual and group therapy) [address] [place], [place] [address]   [phone]  24- Hour Availability:  [facility] [phone] or [phone] * [facility] [employer] (Domestic Violence, Rape, etc. )[phone] Vesta Mixer [phone] or [phone] * [facility] [phone] only) [phone] (after hours) *Therapeutic  Alternative Mobile Crisis Unit [phone] *[place] National Suicide Hotline [phone] Len Childs)

## 2023-01-09 NOTE — Assessment & Plan Note (Signed)
Patient due for colonoscopy, mammogram, low-dose CT scan.  Patient is not insured but has applied for Medicaid at this time.  Patient wanting mammogram and low-dose CT scan today.  Patient would like to hold off on colonoscopy for the future. - Mammogram - Low-dose CT chest - Consider colonoscopy/fit test at follow-up visit

## 2023-01-09 NOTE — Progress Notes (Signed)
SUBJECTIVE:   CHIEF COMPLAINT / HPI:   Chronic Back Pain -Seen previously in 03/28/21 noted to have DG Lumbar showing arthritis -Tx w/ baclofen 10 mg TID, gabapentin 200 mg qhs, Tylenol 500 mg q6h prn -Last seen 12/19/22 requesting tramadol consideration Will discuss at follow up appointment.  Anxiety -Last seen 12/19/22 w/ notable social stressors, anxiety, and tearful affect > plan to f/u w/ psychiatrist & therapist. Reports her anxiety is better and she is taking her meds in morning as recommended by her psychiatrist. Reports she is feeling better, but still has periods of tearfulness.   STI testing Wanting routine testing for STI's, no symptoms of discharge or burning, has been w/ same partner for years, does not use protection.   Unintentional Weight loss -Lost 10 lb from 07/26/21 to 12/19/22   -Was to f/u w/ Dr. Gerilyn Pilgrim  -Last seen 12/19/22 where she noted 3 balanced meals a day  Tobacco use: Has been smoking for 24 years, smoking 2 ppd. Interested in quitting.   Health Mait: Colonoscopy Mammogram Low Dose CT   PERTINENT  PMH / PSH:    Patient Care Team: Bess Kinds, MD as PCP - General (Family Medicine) OBJECTIVE:  BP 128/74   Pulse 79   Ht 5\' 5"  (1.651 m)   Wt 103 lb 3.2 oz (46.8 kg)   LMP 05/04/2013   SpO2 99%   BMI 17.17 kg/m  Physical Exam Constitutional:      General: She is not in acute distress.    Appearance: Normal appearance. She is not ill-appearing.  Pulmonary:     Effort: Pulmonary effort is normal.     Breath sounds: No wheezing.  Neurological:     Mental Status: She is alert.  Psychiatric:        Attention and Perception: She does not perceive auditory or visual hallucinations.        Mood and Affect: Mood is depressed. Mood is not anxious. Affect is not labile or tearful.        Speech: Speech normal. She is communicative. Speech is not rapid and pressured, slurred or tangential.        Behavior: Behavior normal. Behavior is not agitated,  slowed, aggressive, withdrawn or combative. Behavior is cooperative.        Thought Content: Thought content does not include homicidal or suicidal ideation. Thought content does not include homicidal or suicidal plan.      ASSESSMENT/PLAN:  Encounter for screening mammogram for malignant neoplasm of breast -     3D Screening Mammogram, Left and Right; Future  Screening for lung cancer -     CT CHEST LUNG CANCER SCREENING LOW DOSE WO CONTRAST; Future  Screening examination for STI -     RPR w/reflex to TrepSure -     Cervicovaginal ancillary only  Routine screening for STI (sexually transmitted infection) Assessment & Plan: Patient wanting routine screening for sexually transmitted infections.  Patient denies any discharge, burning, or itching.  Patient has had same partner for years and does not use protection. - HIV/syphilis/BV/trichomoniasis/GC/CT   Anxiety Assessment & Plan: Patient reports anxiety has improved since seeing her psychiatrist.  Patient notes that she was told to take her medicine in the morning as opposed to the evenings.  This has made a difference in her anxiety level and seems to help.  Patient notes she still has periods of tearfulness.  Through shared decision making patient's decided she would like to speak with a therapist.  Therapy resources provided. -  Finding follow up with therapist weekly - Therapy resources given   Tobacco abuse Assessment & Plan: Patient notes that she has been smoking cigarettes for 24 years.  She smokes about 2 packs/day currently.  She is interested in quitting.  Will connect her with Dr. Alonna Minium for smoking cessation. - Schedule appointment with Dr. Alonna Minium for smoking cessation   Unintentional weight loss Assessment & Plan: Patient with 10 pound weight loss over 2 years unintentionally.  Patient had negative Pap smear, making cervical cancer unlikely.  Patient is due for colonoscopy, mammogram, and low-dose CT.   Patient agreeable to mammogram and low-dose CT today.  Patient plans to schedule appointment with clinic dietitian Dr. Wyona Almas.  Some concern that weight loss could be coming from potential malignancy, given patient is due for several screenings.  Will attempt to collect screenings, as patient is willing, and will also refer to clinic dietitian for counseling on how to gain weight.  Patient notes in last visit that she gets 3 balanced meals a day, and is unsure why she has lost weight. - Schedule appointment with Dr. Gerilyn Pilgrim - Consider colonoscopy/FIT testing   Health care maintenance Assessment & Plan: Patient due for colonoscopy, mammogram, low-dose CT scan.  Patient is not insured but has applied for Medicaid at this time.  Patient wanting mammogram and low-dose CT scan today.  Patient would like to hold off on colonoscopy for the future. - Mammogram - Low-dose CT chest - Consider colonoscopy/fit test at follow-up visit    No follow-ups on file. Bess Kinds, MD 01/09/2023, 12:37 PM PGY-2, [facility]

## 2023-01-09 NOTE — Assessment & Plan Note (Signed)
Patient notes that she has been smoking cigarettes for 24 years.  She smokes about 2 packs/day currently.  She is interested in quitting.  Will connect her with Dr. Alonna Minium for smoking cessation. - Schedule appointment with Dr. Alonna Minium for smoking cessation

## 2023-01-10 LAB — CERVICOVAGINAL ANCILLARY ONLY
Bacterial Vaginitis (gardnerella): NEGATIVE
Candida Glabrata: NEGATIVE
Candida Vaginitis: NEGATIVE
Chlamydia: NEGATIVE
Comment: NEGATIVE
Comment: NEGATIVE
Comment: NEGATIVE
Comment: NEGATIVE
Comment: NEGATIVE
Comment: NORMAL
Neisseria Gonorrhea: NEGATIVE
Trichomonas: NEGATIVE

## 2023-01-10 LAB — TREPONEMAL ANTIBODIES, TPPA: Treponemal Antibodies, TPPA: NONREACTIVE

## 2023-01-10 LAB — RPR W/REFLEX TO TREPSURE: RPR: NONREACTIVE

## 2023-01-11 ENCOUNTER — Encounter: Payer: Self-pay | Admitting: Student

## 2023-01-12 ENCOUNTER — Other Ambulatory Visit: Payer: Self-pay | Admitting: Student
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
## 2023-01-29 ENCOUNTER — Ambulatory Visit: Payer: Self-pay | Admitting: Student

## 2023-02-05 ENCOUNTER — Ambulatory Visit (HOSPITAL_COMMUNITY)
Admission: RE | Admit: 2023-02-05 | Discharge: 2023-02-05 | Disposition: A | Discharge status: Home / Self Care | Payer: Self-pay | Source: Ambulatory Visit | Attending: Family Medicine | Admitting: Family Medicine

## 2023-02-05 DIAGNOSIS — Z122 Encounter for screening for malignant neoplasm of respiratory organs: Secondary | ICD-10-CM | POA: Insufficient documentation

## 2023-02-11 ENCOUNTER — Encounter: Payer: Self-pay | Admitting: Student

## 2023-03-05 ENCOUNTER — Encounter: Payer: Self-pay | Admitting: Student

## 2023-03-05 ENCOUNTER — Ambulatory Visit: Payer: Medicaid Other | Admitting: Student

## 2023-03-05 ENCOUNTER — Other Ambulatory Visit: Payer: Self-pay

## 2023-03-05 VITALS — BP 116/75 | HR 65 | Ht 65.0 in | Wt 109.4 lb

## 2023-03-05 DIAGNOSIS — N281 Cyst of kidney, acquired: Secondary | ICD-10-CM | POA: Diagnosis not present

## 2023-03-05 DIAGNOSIS — F419 Anxiety disorder, unspecified: Secondary | ICD-10-CM

## 2023-03-05 NOTE — Progress Notes (Signed)
SUBJECTIVE:   CHIEF COMPLAINT / HPI:   F/u Testing Low dose CT chest negative: Renal Cyst observed rec Renal US, Mild emphysema noted on imaging  Today: Note's history of enlarged kidney with scar tissue, despite no surgery. Was worked up for it and everything was benign. Is wanting Renal US. Has no issues, but notes some pain in side every now and again.   Anxiety: F/u w/ Psych and therapist Today: Notes she followed up with her psychiatrist, and has another appt at end of the month. Is taking celexa and it helps, sometimes feeling sad, but not feeling down/like bursting into tears. Denies any SI/HI. Reports she is doing okay/well.  HM: Due for colonoscopy, would like to get in future, after she is caught up on other  medical appointments.   Back Pain/Leg Pain -Tx w/ baclofen 10 mg TID, gabapentin 200 mg qhs, Tylenol 500 mg q6h prn Will discuss at next visit      PERTINENT  PMH / PSH:    Patient Care Team: Bess Kinds, MD as PCP - General (Family Medicine) OBJECTIVE:  BP 116/75   Pulse 65   Ht 5\' 5"  (1.651 m)   Wt 109 lb 6.4 oz (49.6 kg)   LMP 05/04/2013   SpO2 100%   BMI 18.21 kg/m  Physical Exam Constitutional:      General: She is not in acute distress.    Appearance: Normal appearance. She is not ill-appearing or toxic-appearing.  Cardiovascular:     Rate and Rhythm: Normal rate and regular rhythm.     Pulses: Normal pulses.     Heart sounds: Normal heart sounds. No murmur heard.    No friction rub. No gallop.  Pulmonary:     Effort: Pulmonary effort is normal. No respiratory distress.     Breath sounds: Normal breath sounds. No stridor. No wheezing, rhonchi or rales.  Chest:     Chest wall: No tenderness.  Abdominal:     General: Abdomen is flat. There is no distension.     Palpations: Abdomen is soft. There is no mass.     Tenderness: There is no abdominal tenderness.     Hernia: No hernia is present.  Neurological:     Mental Status: She is  alert.  Psychiatric:        Mood and Affect: Mood normal.        Behavior: Behavior normal.        Thought Content: Thought content normal.      ASSESSMENT/PLAN:  Renal cyst, left Assessment & Plan: Patient received low-dose CT scan for lung cancer screening, had incidental finding of renal cyst on left kidney.  Patient came in for discussion of CT findings.  Patient reports a remote history of enlarged kidney, with scar tissue, despite never having had surgery.  Renal ultrasound recommended for follow-up, and patient in agreement with further imaging.  Will obtain renal ultrasound.  Will consider urology follow-up if necessary. - Renal ultrasound  Orders: -     US RENAL; Future  Anxiety Assessment & Plan: Patient reports her anxiety is being managed well, and that she has not had periods of feeling like bursting into tears.  Patient notes she still has periods of sadness however it is improved.  Patient has followed up with her psychiatrist, and has follow-up at the end of the month.  Patient continues to take Celexa, and feels that it is helping. - Follow-up with psychiatrist - Continue Celexa 20mg     No  follow-ups on file. Bess Kinds, MD 03/07/2023, 7:21 PM PGY-3, [facility]

## 2023-03-05 NOTE — Patient Instructions (Addendum)
It was great to see you! Thank you for allowing me to participate in your care!  I recommend that you always bring your medications to each appointment as this makes it easy to ensure we are on the correct medications and helps [place] not miss when refills are needed.  Our plans for today:  - Renal Cyst A fluid collection was noted on your left kidney. We are going to get an ultrasound to investigate what it is/see if anything needs to be done.  - Emphysema This is the start of COPD, and comes from smoking. If you are not having breathing issues, we don't need to worry about this now. If breathing get's worse, let me know, we will get lung function testing, and see about starting inhalers   - Back/Leg Pain Make an appointment in the future to discuss your back/leg pain. We will figure out what's been going   Make appointment whenever works best for you  - Health Maintenance/Screening You are due for a colonoscopy. Let me know when you'd like to have it, and I'll place the referral to get you scheduled  - Weight Change The scale today showed that you are gaining weight, but this may be an error with the scale. I will see you at our appointment with Dr. Wyona Almas, and we will see what can be done to continue gaining weight. Looking forward to it!  Take care and seek immediate care sooner if you develop any concerns.   Dr. Bess Kinds, MD [facility]

## 2023-03-07 DIAGNOSIS — N281 Cyst of kidney, acquired: Secondary | ICD-10-CM | POA: Insufficient documentation

## 2023-03-07 NOTE — Assessment & Plan Note (Signed)
Patient received low-dose CT scan for lung cancer screening, had incidental finding of renal cyst on left kidney.  Patient came in for discussion of CT findings.  Patient reports a remote history of enlarged kidney, with scar tissue, despite never having had surgery.  Renal ultrasound recommended for follow-up, and patient in agreement with further imaging.  Will obtain renal ultrasound.  Will consider urology follow-up if necessary. - Renal ultrasound

## 2023-03-07 NOTE — Assessment & Plan Note (Addendum)
Patient reports her anxiety is being managed well, and that she has not had periods of feeling like bursting into tears.  Patient notes she still has periods of sadness however it is improved.  Patient has followed up with her psychiatrist, and has follow-up at the end of the month.  Patient continues to take Celexa, and feels that it is helping. - Follow-up with psychiatrist - Continue Celexa 20mg 

## 2023-03-12 ENCOUNTER — Other Ambulatory Visit: Payer: Self-pay | Admitting: Student

## 2023-03-12 ENCOUNTER — Ambulatory Visit (HOSPITAL_COMMUNITY)
Admission: RE | Admit: 2023-03-12 | Discharge: 2023-03-12 | Disposition: A | Discharge status: Home / Self Care | Payer: Medicaid Other | Source: Ambulatory Visit | Attending: Family Medicine | Admitting: Family Medicine

## 2023-03-12 DIAGNOSIS — N281 Cyst of kidney, acquired: Secondary | ICD-10-CM | POA: Insufficient documentation

## 2023-03-12 DIAGNOSIS — N130 Hydronephrosis with ureteropelvic junction obstruction: Secondary | ICD-10-CM | POA: Diagnosis not present

## 2023-03-19 ENCOUNTER — Ambulatory Visit
Admission: RE | Admit: 2023-03-19 | Discharge: 2023-03-19 | Disposition: A | Discharge status: Home / Self Care | Payer: Medicaid Other | Source: Ambulatory Visit | Attending: Family Medicine | Admitting: Family Medicine

## 2023-03-19 DIAGNOSIS — Z1231 Encounter for screening mammogram for malignant neoplasm of breast: Secondary | ICD-10-CM

## 2023-04-05 ENCOUNTER — Ambulatory Visit: Payer: Medicaid Other | Admitting: Student

## 2023-04-05 VITALS — Ht 65.0 in | Wt 102.0 lb

## 2023-04-05 DIAGNOSIS — R636 Underweight: Secondary | ICD-10-CM

## 2023-04-05 DIAGNOSIS — M545 Low back pain, unspecified: Secondary | ICD-10-CM | POA: Diagnosis not present

## 2023-04-05 NOTE — Progress Notes (Signed)
What do you want to get out of meeting with me today? Looking to gain weight/figure out what's going on.   Relevant history/background:  History on 10 lb weight loss in 2 years. Patient reports eating 3 meals a day, that are mostly balanced with cards and protein and hit/miss on vegetables.   Assessment:  On a normal day, do you eat breakfast/lunch/dinner or which of these? Normally eats breakfast and dinner, based on work schedule 3-4 days a week.  How many snacks do you eat per day? 2 What beverages do you drink most days of the week? Water, up to 24 oz soda/day.   What foods do you eat most days of the week? Hamburger, pork chops, chicken, pork loin.   Usual physical activity: None. Sleep: Patient estimates average of 6.5 hours of sleep/night. (Once a week she may get 8-10 hours)  24-hr recall:  (Up at 4:45 AM) OOB  B (8 AM)- 2 sausage biscuits, 12 oz soda Snk ( AM) ---  L ( 12:30 PM) Pack of crackers, "nabs"  Snk ( PM)--- D ( 7 PM)  Fried chicken (2 legs), 2 c mashed potatoes, 2 c collard greens, 1 garlic toast  Snk ( PM)--- Typical day? Yes.     Intervention: Completed diet and exercise history, and established: - SMART behavioral goals (Specific, Measurable, Action-oriented, Realistic, Time-based.)  1. Eat at least 3 REAL meals and 1-2 snacks per day.  Eat breakfast within one hour of getting up. (OR: Would you serve this to a guest in your home, and call it a meal?)      -Aim for no more than 5 hours between eating.        -A REAL breakfast includes at least some protein and some starch (fruit optional).        -A REAL lunch or dinner includes at least some protein, some starch, and vegetables and/or fruit.        -Pack your lunch the night before, take with you, and eat throughout lunch period      -Snack between meals, (peanut butter and crackers/Protein bars)  2. Exercise with body weight, 3 sets of ten (Chair Squats, dumbbell curls, wall push-ups, sit ups, dancing), 3 days  a week      -Placing referral to PT for program of exercises      -Write down exercise plan  3. Go to bed earlier, to get more sleep. Goal of 8 hours a night     -Goal sheet given to document progress  For recommendations and goals, see Patient Instructions.    Follow-up: 5 weeks.    Bess Kinds

## 2023-04-05 NOTE — Patient Instructions (Addendum)
It was great to see you! Thank you for allowing me to participate in your care!  Our plans for today:  Behavioral goals    1. Eat at least 3 REAL meals and 1-2 snacks per day.  Eat breakfast within one hour of getting up. (OR: Would you serve this to a guest in your home, and call it a meal?)      -Aim for no more than 5 hours between eating.        -A REAL breakfast includes at least some protein and some starch (fruit optional).        -A REAL lunch or dinner includes at least some protein, some starch, and vegetables and/or fruit.        -Pack your lunch the night before, take with you, and eat throughout lunch period      -Snack between meals, (peanut butter and crackers/Protein bars)  2. Exercise with body weight, 3 sets of ten (Chair Squats, dumbbell curls, wall push-ups, sit ups, dancing), 3 days a week      -Placing referral to PT for program of exercises      -Write down exercise plan  3. Go to bed earlier, to get more sleep. Goal of 8 hours a night      Document progress of above goals on goals sheet given today, and bring to follow up.   Follow-up: 5 weeks. on 10/2 at 11:30 am  Take care and seek immediate care sooner if you develop any concerns.   Dr. Bess Kinds, MD [facility]

## 2023-05-07 ENCOUNTER — Ambulatory Visit: Payer: Medicaid Other | Attending: Family Medicine | Admitting: Physical Therapy

## 2023-05-07 DIAGNOSIS — M545 Low back pain, unspecified: Secondary | ICD-10-CM | POA: Diagnosis not present

## 2023-05-07 DIAGNOSIS — M6281 Muscle weakness (generalized): Secondary | ICD-10-CM | POA: Insufficient documentation

## 2023-05-07 DIAGNOSIS — M5459 Other low back pain: Secondary | ICD-10-CM | POA: Diagnosis not present

## 2023-05-07 NOTE — Therapy (Unsigned)
OUTPATIENT PHYSICAL THERAPY THORACOLUMBAR EVALUATION   Patient Name: Jeanne Huynh MRN: [id number] DOB:1965/10/09, 57 y.o., female Today's Date: 05/07/2023  END OF SESSION:  PT End of Session - 05/07/23 1335     Visit Number 1    Number of Visits 16    Date for PT Re-Evaluation 07/02/23    Authorization Type [place] [employer]    PT Start Time 1330    PT Stop Time 1415    PT Time Calculation (min) 45 min    Activity Tolerance Patient tolerated treatment well;Patient limited by pain    Behavior During Therapy [facility] for tasks assessed/performed             Past Medical History:  Diagnosis Date   Asthma    Enlarged kidney    Kidney stone    No past surgical history on file. Patient Active Problem List   Diagnosis Date Noted   Renal cyst, left 03/07/2023   Routine screening for STI (sexually transmitted infection) 01/09/2023   Tobacco abuse 01/09/2023   Health care maintenance 01/09/2023   Unintentional weight loss 12/20/2022   Anxiety 12/20/2022   Lumbar back pain 03/07/2021   Does not have health insurance 01/16/2021   Abnormal urine finding 01/16/2021   Leg pain, bilateral 12/23/2020   Pyelonephritis 06/25/2013    PCP: Dr. Bess Kinds REFERRING PROVIDER: Dr. Bess Kinds  REFERRING DIAG: M54.50 (ICD-10-CM) - Lumbar back pain  Rationale for Evaluation and Treatment: Rehabilitation  THERAPY DIAG:  Muscle weakness (generalized)  Other low back pain  ONSET DATE: chronic   SUBJECTIVE:                                                                                                                                                                                           SUBJECTIVE STATEMENT: I have bad back pain. My legs are so weak and they don't know why.  Legs lock up , spasms in legs and feet.  Pt has known lumbar arthritis.  She went to the hospital about 2 yrs ago for LE spasms and weakness. Her pain has worsened.  She has pain in tailbone  which travels down both legs.  Described as burning and stinging. She has difficulty stooping and coming back up. Reaching overhead at work is hard.  Sleep disturbances with pain waking her up at night.  Pain with reaching back, twisting. Sitting increased tailbone pain, if not supported, has to pace a lot. Standing (still) affects LE pain.  Walking usually improves her pain to a degree.   PERTINENT HISTORY:  Seeing Dietitian for underweight Anxiety  PAIN:  Are you having  pain? Yes: NPRS scale: 5/10 Pain location: L5 to legs Pain description: stinging, burning  Aggravating factors: Sitting increases tailbone pain standing increasing back and leg pain Relieving factors: Lying down, heating pad  PRECAUTIONS: None  RED FLAGS: None   WEIGHT BEARING RESTRICTIONS: No  FALLS:  Has patient fallen in last 6 months? Yes. Number of falls 1, fell in bathroom   LIVING ENVIRONMENT: Lives with: lives with their spouse Lives in: House/apartment Stairs: No Has following equipment at home: None  OCCUPATION: works part time , [employer]  PLOF: Independent with basic ADLs, Independent with household mobility without device, Independent with community mobility without device, Vocation/Vocational requirements: standing, does not lift at work, and Leisure: family   PATIENT GOALS: I want to have less pain , strengthen my arms and legs   NEXT MD VISIT: As needed  OBJECTIVE:   DIAGNOSTIC FINDINGS:  FINDINGS: Five lumbar type vertebral bodies. There is no evidence of lumbar spine fracture. Straightening of the normal lumbar lordosis. L2-L3 and L3-L4 disc space narrowing with osteophytosis. Lumbar facet hypertrophy. Aortic atherosclerosis.   IMPRESSION: Slight interval increase in lumbar spondylosis most notably at L2-L3 and L3-L4. No acute osseous abnormality.  PATIENT SURVEYS:  FOTO 39%  SCREENING FOR RED FLAGS: Bowel or bladder incontinence: No Spinal tumors: No Cauda equina  syndrome: No Compression fracture: No Abdominal aneurysm: No  COGNITION: Overall cognitive status: Within functional limits for tasks assessed     SENSATION: WFL For MUSCLE LENGTH: Hamstrings: Right 45 deg; Left 45 deg  POSTURE: rounded shoulders, forward head, and decreased lumbar lordosis  PALPATION: Pain centrally L 5 and SI border soreness in bilateral glutes  LUMBAR ROM:   AROM eval  Flexion WNL pain returning   Extension Painful, 25%   Right lateral flexion WNL   Left lateral flexion WNL  Right rotation WNL  Left rotation WNL    (Blank rows = not tested)  LOWER EXTREMITY ROM:   WNL   Active  Right eval Left eval  Hip flexion    Hip extension    Hip abduction    Hip adduction    Hip internal rotation    Hip external rotation    Knee flexion    Knee extension    Ankle dorsiflexion    Ankle plantarflexion    Ankle inversion    Ankle eversion     (Blank rows = not tested)  LOWER EXTREMITY MMT:    MMT Right eval Left eval  Hip flexion 4 4  Hip extension 3+ 3+  Hip abduction 3+ 3+  Hip adduction    Hip internal rotation    Hip external rotation    Knee flexion 4 4-  Knee extension 4+ 4+  Ankle dorsiflexion    Ankle plantarflexion    Ankle inversion    Ankle eversion     (Blank rows = not tested)  LUMBAR SPECIAL TESTS:  Prone instability test: Negative, Straight leg raise test: Negative, Slump test: Negative, Single leg stance test: Negative, and Trendelenburg sign: Negative  FUNCTIONAL TESTS:  5 times sit to stand: 10.8 sec 30 seconds chair stand test  13 reps   GAIT: Distance walked: 150 Assistive device utilized: None Level of assistance: SBA Comments: Post session right knee hyperextends, weakness  TODAY'S TREATMENT:  DATEMarlane Mingle Adult PT Treatment:                                                DATE:  05/07/23 Therapeutic Exercise: LTR Bridging Sit to stand Not done: hamstring stretch and wall push ups  Self Care: Eval findings      PATIENT EDUCATION:  Education details: HEP, self care, nerve pain , causes of weakness  Person educated: Patient Education method: Explanation, Demonstration, Verbal cues, and Handouts Education comprehension: verbalized understanding, returned demonstration, and needs further education  HOME EXERCISE PROGRAM: Access Code: [email or web address] URL: [email or web address] Date: 05/07/2023 Prepared by: Karie Mainland  Exercises - Supine Lower Trunk Rotation  - 1 x daily - 7 x weekly - 2 sets - 10 reps - 10 hold - Supine Bridge  - 1 x daily - 7 x weekly - 2 sets - 10 reps - 5 hold - Sidelying Hip Abduction  - 1 x daily - 7 x weekly - 2 sets - 10 reps - 5 hold - Seated Hamstring Stretch  - 1 x daily - 7 x weekly - 1 sets - 3-5 reps - 30 hold - Sit to Stand with Counter Support  - 1 x daily - 7 x weekly - 2 sets - 10 reps - 5 hold - Wall Push Up  - 1 x daily - 7 x weekly - 2 sets - 10 reps - 5 hold  ASSESSMENT:  CLINICAL IMPRESSION: Patient is a 57 y.o. female who was seen today for physical therapy evaluation and treatment for low back pain. She does live in [place] and I mentioned [facility] which is closer but she does keep her MD appts in [place] and prefers to stay here.   OBJECTIVE IMPAIRMENTS: decreased activity tolerance, decreased balance, decreased endurance, decreased mobility, difficulty walking, decreased ROM, decreased strength, increased fascial restrictions, increased muscle spasms, improper body mechanics, postural dysfunction, and pain.   ACTIVITY LIMITATIONS: carrying, lifting, bending, sitting, standing, squatting, sleeping, bed mobility, and locomotion level, reaching overhead   PARTICIPATION LIMITATIONS: cleaning, community activity, and occupation  PERSONAL FACTORS: Time since onset of injury/illness/exacerbation and 1 comorbidity:  Anxiety  are also affecting patient's functional outcome.   REHAB POTENTIAL: Excellent  CLINICAL DECISION MAKING: Stable/uncomplicated  EVALUATION COMPLEXITY: Low   GOALS: Goals reviewed with patient? Yes  SHORT TERM GOALS: Target date: 06/04/2023    Pt will be I with initial HEP  Baseline: Given on Goal status: INITIAL  2.  Patient will complete functional testing and set goal Baseline: NT Goal status: INITIAL  3.  Patient will notice improved strength in legs with transfers and post PT Baseline: Legs felt very weak after her first session right knee with hyperextending Goal status: INITIAL   LONG TERM GOALS: Target date: 07/02/2023    Foto score will improve to 47% Baseline: 39% Goal status: INITIAL  2.  Patient will be independent with home exercise program for long-term health Baseline: unknown  Goal status: INITIAL  3.  Patient will be able to sit 30 minutes comfortably for meals with family Baseline: increased sacral pain, severe  Goal status: INITIAL  4.  Patient will be able to demonstrate proper squat mechanics for lifting light items at home  Baseline: needs cues  Goal status: INITIAL  5.  Patient will be able to improve lower extremity strength 5 out of  5 Baseline: see above  Goal status: INITIAL  6.  Patient will be able to reach overhead without increased back/leg pain for improved home tasks and work Baseline: Increased pain in back  goal status: INITIAL  PLAN:  PT FREQUENCY: 2x/week  PT DURATION: 8 weeks  PLANNED INTERVENTIONS: Therapeutic exercises, Therapeutic activity, Neuromuscular re-education, Balance training, Gait training, Patient/Family education, Self Care, Joint mobilization, Dry Needling, Spinal mobilization, Cryotherapy, Moist heat, Taping, Manual therapy, and Re-evaluation.  PLAN FOR NEXT SESSION: check HPE.  UBE , Nustep, walking test (2 vs 6 )    Chanie Soucek, PT 05/07/2023, 5:23 PM   Check all possible CPT codes:  97164 - PT Re-evaluation, 97110- Therapeutic Exercise, [phone]- Neuro Re-education, 97140 - Manual Therapy, 97530 - Therapeutic Activities, [phone] - Self Care, 27035 - Electrical stimulation (unattended), and 97750 - Physical performance training    Check all conditions that are expected to impact treatment: {Conditions expected to impact treatment:Social determinants of health   If treatment provided at initial evaluation, no treatment charged due to lack of authorization.

## 2023-05-08 ENCOUNTER — Encounter: Payer: Self-pay | Admitting: Physical Therapy

## 2023-05-10 ENCOUNTER — Other Ambulatory Visit: Payer: Self-pay | Admitting: *Deleted

## 2023-05-11 MED ORDER — GABAPENTIN 100 MG PO CAPS: 200.0000 mg | ORAL_CAPSULE | Freq: Every day | ORAL | 1 refills | Status: DC

## 2023-05-16 ENCOUNTER — Other Ambulatory Visit: Payer: Self-pay

## 2023-05-16 ENCOUNTER — Encounter: Payer: Self-pay | Admitting: Student

## 2023-05-16 ENCOUNTER — Ambulatory Visit: Payer: Medicaid Other | Admitting: Student

## 2023-05-16 VITALS — BP 155/84 | HR 92 | Ht 65.0 in | Wt 98.2 lb

## 2023-05-16 DIAGNOSIS — R634 Abnormal weight loss: Secondary | ICD-10-CM | POA: Diagnosis not present

## 2023-05-16 NOTE — Patient Instructions (Addendum)
It was great to see you! Thank you for allowing me to participate in your care!  I recommend that you always bring your medications to each appointment as this makes it easy to ensure we are on the correct medications and helps [place] not miss when refills are needed.  Our plans for today:  -Weight loss I'm sorry that you have not been gaining weight, despite your best efforts. We will keep at this! We will also want to rule out malignancy, so get your colonoscopy scheduled as soon as possible.  We will also check some labs that could cause weight loss.   Let's try increasing the amount of calories you're getting. For a snack I'd like to you to drink Boost, Ensure try to get 2 in a day, along with 3 good meals. Continue to snack!  Continue current diet plan  Get your colonoscopy scheduled when you can   Pick a location and call based of the paper given     Follow up in 1 month   - Back/Leg Pain I'm sorry to hear about your gabapentin, let's try some lidocaine patches to bridge you until the next refill.   Salonpass can be worn for 12 hours on, then 12 hours off *I recommend you place them in the evenings, so you can get relief for sleep   -  We are checking some labs today, I will call you if they are abnormal will send you a MyChart message or a letter if they are normal.  If you do not hear about your labs in the next 2 weeks please let us know.  Take care and seek immediate care sooner if you develop any concerns.   Dr. Bess Kinds, MD [facility]

## 2023-05-16 NOTE — Progress Notes (Signed)
  SUBJECTIVE:   CHIEF COMPLAINT / HPI:   Low Weight -Seen 8/22 w/ RD for consult on gaining weight  -plan to eat 3 meals, exercise 3 x a week, and go to sleep earlier  -Wt 102 at that appointment  Back Pain  Someone used all her gabapentin up, she is very upset and tearful at visit. Note's that someone has been staying with her and must have taken them.   PERTINENT  PMH / PSH:   OBJECTIVE:  BP (!) 155/84   Pulse 92   Ht 5\' 5"  (1.651 m)   Wt 98 lb 3.2 oz (44.5 kg)   LMP 05/04/2013   SpO2 100%   BMI 16.34 kg/m  Physical Exam Constitutional:      Appearance: Normal appearance. She is underweight. She is not ill-appearing.  Neurological:     Mental Status: She is alert.      ASSESSMENT/PLAN:  Unintentional weight loss Assessment & Plan: Patient comes in for f/u on her weight. She continues to loose weight despite doing all recommended interventions. Wt previously 102 lb on 04/05/23, now 98 lb today. Patient is up to date on all cancer screening's except colonoscopy. I will recommend patient schedule her colonoscopy for possible cause. It's unusual for patient to loose so much weight, despite eating more and exercising. Provider concerned patient may not be following the regimen, or may have malignancy. Will have patient increase calorie input with booste/ensure shakes between meals. Plan for f/u 1 month. -Add booste/ensure shakes to between meal snacks -Patient to schedule colonoscopy, resources given -F/u 1 month  Orders: -     TSH Rfx on Abnormal to Free T4 -     HIV Antibody (routine testing w rflx)   No follow-ups on file. Bess Kinds, MD 05/18/2023, 7:18 PM PGY-3, [facility]

## 2023-05-18 NOTE — Assessment & Plan Note (Signed)
Patient comes in for f/u on her weight. She continues to loose weight despite doing all recommended interventions. Wt previously 102 lb on 04/05/23, now 98 lb today. Patient is up to date on all cancer screening's except colonoscopy. I will recommend patient schedule her colonoscopy for possible cause. It's unusual for patient to loose so much weight, despite eating more and exercising. Provider concerned patient may not be following the regimen, or may have malignancy. Will have patient increase calorie input with booste/ensure shakes between meals. Plan for f/u 1 month. -Add booste/ensure shakes to between meal snacks -Patient to schedule colonoscopy, resources given -F/u 1 month

## 2023-05-30 ENCOUNTER — Encounter: Payer: Self-pay | Admitting: Physical Therapy

## 2023-05-30 ENCOUNTER — Ambulatory Visit: Payer: Medicaid Other | Attending: Family Medicine | Admitting: Physical Therapy

## 2023-05-30 DIAGNOSIS — M6281 Muscle weakness (generalized): Secondary | ICD-10-CM | POA: Insufficient documentation

## 2023-05-30 DIAGNOSIS — M5459 Other low back pain: Secondary | ICD-10-CM | POA: Diagnosis not present

## 2023-05-30 NOTE — Therapy (Signed)
OUTPATIENT PHYSICAL THERAPY THORACOLUMBAR TREATMENT    Patient Name: Jeanne Huynh MRN: [id number] DOB:05-20-1966, 57 y.o., female Today's Date: 05/30/2023  END OF SESSION:  PT End of Session - 05/30/23 1525     Visit Number 2    Number of Visits 16    Date for PT Re-Evaluation 07/02/23    Authorization Type [employer]    PT Start Time 0330    PT Stop Time 0409    PT Time Calculation (min) 39 min             Past Medical History:  Diagnosis Date   Asthma    Enlarged kidney    Kidney stone    History reviewed. No pertinent surgical history. Patient Active Problem List   Diagnosis Date Noted   Renal cyst, left 03/07/2023   Routine screening for STI (sexually transmitted infection) 01/09/2023   Tobacco abuse 01/09/2023   Health care maintenance 01/09/2023   Unintentional weight loss 12/20/2022   Anxiety 12/20/2022   Lumbar back pain 03/07/2021   Does not have health insurance 01/16/2021   Abnormal urine finding 01/16/2021   Leg pain, bilateral 12/23/2020   Pyelonephritis 06/25/2013    PCP: Dr. Bess Kinds REFERRING PROVIDER: Dr. Bess Kinds  REFERRING DIAG: M54.50 (ICD-10-CM) - Lumbar back pain  Rationale for Evaluation and Treatment: Rehabilitation  THERAPY DIAG:  Muscle weakness (generalized)  Other low back pain  ONSET DATE: chronic   SUBJECTIVE:                                                                                                                                                                                           SUBJECTIVE STATEMENT: I did the exercises as best as could. 4 x per week. Feeling it in my thighs today and tailbone.    EVAL: I have bad back pain. My legs are so weak and they don't know why.  Legs lock up , spasms in legs and feet.  Pt has known lumbar arthritis.  She went to the hospital about 2 yrs ago for LE spasms and weakness. Her pain has worsened.  She has pain in tailbone which travels down  both legs.  Described as burning and stinging. She has difficulty stooping and coming back up. Reaching overhead at work is hard.  Sleep disturbances with pain waking her up at night.  Pain with reaching back, twisting. Sitting increased tailbone pain, if not supported, has to pace a lot. Standing (still) affects LE pain.  Walking usually improves her pain to a degree.   PERTINENT HISTORY:  Seeing Dietitian for underweight Anxiety  PAIN:  Are you having pain? Yes: NPRS scale: 4/10 Pain location:bilateral thighs Pain description: stinging, burning  Aggravating factors: Sitting increases tailbone pain standing increasing back and leg pain Relieving factors: Lying down, heating pad  PRECAUTIONS: None  RED FLAGS: None   WEIGHT BEARING RESTRICTIONS: No  FALLS:  Has patient fallen in last 6 months? Yes. Number of falls 1, fell in bathroom   LIVING ENVIRONMENT: Lives with: lives with their spouse Lives in: House/apartment Stairs: No Has following equipment at home: None  OCCUPATION: works part time , [employer]  PLOF: Independent with basic ADLs, Independent with household mobility without device, Independent with community mobility without device, Vocation/Vocational requirements: standing, does not lift at work, and Leisure: family   PATIENT GOALS: I want to have less pain , strengthen my arms and legs   NEXT MD VISIT: As needed  OBJECTIVE:   DIAGNOSTIC FINDINGS:  FINDINGS: Five lumbar type vertebral bodies. There is no evidence of lumbar spine fracture. Straightening of the normal lumbar lordosis. L2-L3 and L3-L4 disc space narrowing with osteophytosis. Lumbar facet hypertrophy. Aortic atherosclerosis.   IMPRESSION: Slight interval increase in lumbar spondylosis most notably at L2-L3 and L3-L4. No acute osseous abnormality.  PATIENT SURVEYS:  FOTO 39%  SCREENING FOR RED FLAGS: Bowel or bladder incontinence: No Spinal tumors: No Cauda equina syndrome:  No Compression fracture: No Abdominal aneurysm: No  COGNITION: Overall cognitive status: Within functional limits for tasks assessed     SENSATION: WFL For MUSCLE LENGTH: Hamstrings: Right 45 deg; Left 45 deg  POSTURE: rounded shoulders, forward head, and decreased lumbar lordosis  PALPATION: Pain centrally L 5 and SI border soreness in bilateral glutes  LUMBAR ROM:   AROM eval  Flexion WNL pain returning   Extension Painful, 25%   Right lateral flexion WNL   Left lateral flexion WNL  Right rotation WNL  Left rotation WNL    (Blank rows = not tested)  LOWER EXTREMITY ROM:   WNL   Active  Right eval Left eval  Hip flexion    Hip extension    Hip abduction    Hip adduction    Hip internal rotation    Hip external rotation    Knee flexion    Knee extension    Ankle dorsiflexion    Ankle plantarflexion    Ankle inversion    Ankle eversion     (Blank rows = not tested)  LOWER EXTREMITY MMT:    MMT Right eval Left eval  Hip flexion 4 4  Hip extension 3+ 3+  Hip abduction 3+ 3+  Hip adduction    Hip internal rotation    Hip external rotation    Knee flexion 4 4-  Knee extension 4+ 4+  Ankle dorsiflexion    Ankle plantarflexion    Ankle inversion    Ankle eversion     (Blank rows = not tested)  LUMBAR SPECIAL TESTS:  Prone instability test: Negative, Straight leg raise test: Negative, Slump test: Negative, Single leg stance test: Negative, and Trendelenburg sign: Negative  FUNCTIONAL TESTS:  5 times sit to stand: 10.8 sec 30 seconds chair stand test  13 reps  05/30/23: 2 MWT 445 feet -at 1: 30 sec pt reports increased weakness in anterior thighs and Right knee began to hyper extend/ decreased control   GAIT: Distance walked: 150 Assistive device utilized: None Level of assistance: SBA Comments: Post session right knee hyperextends, weakness  TODAY'S TREATMENT:  [facility] Adult PT Treatment:                                                DATE: 05/30/23 Therapeutic Exercise: Bridge x 10 HL ball squeeze x 10 Supine march SLR x 5 each  Side hip abduction with knee bent x 10 each Side clam x 10 each Supine QS 5 sec x 10 each  LTR Ab press into ball 5 sec x 10 STS x 10  Wall push up x 10  Therapeutic Activity: 2 MWT -see functional tests for details     DATE: [facility] Adult PT Treatment:                                                DATE: 05/07/23 Therapeutic Exercise: LTR Bridging Sit to stand Not done: hamstring stretch and wall push ups  Self Care: Eval findings      PATIENT EDUCATION:  Education details: HEP, self care, nerve pain , causes of weakness  Person educated: Patient Education method: Explanation, Demonstration, Verbal cues, and Handouts Education comprehension: verbalized understanding, returned demonstration, and needs further education  HOME EXERCISE PROGRAM: Access Code: [email or web address] URL: [email or web address] Date: 05/07/2023 Prepared by: Karie Mainland  Exercises - Supine Lower Trunk Rotation  - 1 x daily - 7 x weekly - 2 sets - 10 reps - 10 hold - Supine Bridge  - 1 x daily - 7 x weekly - 2 sets - 10 reps - 5 hold - Sidelying Hip Abduction  - 1 x daily - 7 x weekly - 2 sets - 10 reps - 5 hold - Seated Hamstring Stretch  - 1 x daily - 7 x weekly - 1 sets - 3-5 reps - 30 hold - Sit to Stand with Counter Support  - 1 x daily - 7 x weekly - 2 sets - 10 reps - 5 hold - Wall Push Up  - 1 x daily - 7 x weekly - 2 sets - 10 reps - 5 hold - Active straight leg raise  - 1 x daily - 7 x weekly - 2 sets - 5 reps  ASSESSMENT:  CLINICAL IMPRESSION: Pt arrives reporting thigh pain today. Captured 2 MWT with pt having progressive tight knee instability, unable to control her hyperextension. Reviewed HEP and progressed with quad activation. Updated HEP. Her muscles were fatigued and  the knee instability continued as she left today. Suggested a potential knee brace?    EVAL: Patient is a 57 y.o. female who was seen today for physical therapy evaluation and treatment for low back pain. She does live in [place] and I mentioned [facility] which is closer but she does keep her MD appts in [place] and prefers to stay here.   OBJECTIVE IMPAIRMENTS: decreased activity tolerance, decreased balance, decreased endurance, decreased mobility, difficulty walking, decreased ROM, decreased strength, increased fascial restrictions, increased muscle spasms, improper body mechanics, postural dysfunction, and pain.   ACTIVITY LIMITATIONS: carrying, lifting, bending, sitting, standing, squatting, sleeping, bed mobility, and locomotion level, reaching overhead   PARTICIPATION LIMITATIONS: cleaning, community activity, and occupation  PERSONAL FACTORS: Time since onset of injury/illness/exacerbation and 1 comorbidity: Anxiety  are also affecting patient's functional outcome.   REHAB POTENTIAL: Excellent  CLINICAL DECISION MAKING: Stable/uncomplicated  EVALUATION COMPLEXITY: Low   GOALS: Goals reviewed with patient? Yes  SHORT TERM GOALS: Target date: 06/04/2023    Pt will be I with initial HEP  Baseline: Given on Goal status: INITIAL  2.  Patient will complete functional testing and set goal Baseline: NT Goal status: INITIAL  3.  Patient will notice improved strength in legs with transfers and post PT Baseline: Legs felt very weak after her first session right knee with hyperextending Goal status: INITIAL   LONG TERM GOALS: Target date: 07/02/2023    Foto score will improve to 47% Baseline: 39% Goal status: INITIAL  2.  Patient will be independent with home exercise program for long-term health Baseline: unknown  Goal status: INITIAL  3.  Patient will be able to sit 30 minutes comfortably for meals with family Baseline: increased sacral pain, severe  Goal status:  INITIAL  4.  Patient will be able to demonstrate proper squat mechanics for lifting light items at home  Baseline: needs cues  Goal status: INITIAL  5.  Patient will be able to improve lower extremity strength 5 out of 5 Baseline: see above  Goal status: INITIAL  6.  Patient will be able to reach overhead without increased back/leg pain for improved home tasks and work Baseline: Increased pain in back  goal status: INITIAL  PLAN:  PT FREQUENCY: 2x/week  PT DURATION: 8 weeks  PLANNED INTERVENTIONS: Therapeutic exercises, Therapeutic activity, Neuromuscular re-education, Balance training, Gait training, Patient/Family education, Self Care, Joint mobilization, Dry Needling, Spinal mobilization, Cryotherapy, Moist heat, Taping, Manual therapy, and Re-evaluation.  PLAN FOR NEXT SESSION: check HPE.  Elease Hashimoto, PTA 05/30/23 4:10 PM Phone: [phone] Fax: [phone]   Check all possible CPT codes: 52841 - PT Re-evaluation, 97110- Therapeutic Exercise, [phone]- Neuro Re-education, 97140 - Manual Therapy, 97530 - Therapeutic Activities, 97535 - Self Care, 97014 - Electrical stimulation (unattended), and 97750 - Physical performance training    Check all conditions that are expected to impact treatment: {Conditions expected to impact treatment:Social determinants of health   If treatment provided at initial evaluation, no treatment charged due to lack of authorization.

## 2023-06-06 ENCOUNTER — Ambulatory Visit: Payer: Medicaid Other | Admitting: Physical Therapy

## 2023-06-06 NOTE — Therapy (Deleted)
OUTPATIENT PHYSICAL THERAPY THORACOLUMBAR TREATMENT    Patient Name: Jeanne Huynh MRN: [id number] DOB:04/27/1966, 57 y.o., female Today's Date: 06/06/2023  END OF SESSION:    Past Medical History:  Diagnosis Date   Asthma    Enlarged kidney    Kidney stone    No past surgical history on file. Patient Active Problem List   Diagnosis Date Noted   Renal cyst, left 03/07/2023   Routine screening for STI (sexually transmitted infection) 01/09/2023   Tobacco abuse 01/09/2023   Health care maintenance 01/09/2023   Unintentional weight loss 12/20/2022   Anxiety 12/20/2022   Lumbar back pain 03/07/2021   Does not have health insurance 01/16/2021   Abnormal urine finding 01/16/2021   Leg pain, bilateral 12/23/2020   Pyelonephritis 06/25/2013    PCP: Dr. Bess Kinds REFERRING PROVIDER: Dr. Bess Kinds  REFERRING DIAG: M54.50 (ICD-10-CM) - Lumbar back pain  Rationale for Evaluation and Treatment: Rehabilitation  THERAPY DIAG:  No diagnosis found.  ONSET DATE: chronic   SUBJECTIVE:                                                                                                                                                                                           SUBJECTIVE STATEMENT: I did the exercises as best as could. 4 x per week. Feeling it in my thighs today and tailbone.    EVAL: I have bad back pain. My legs are so weak and they don't know why.  Legs lock up , spasms in legs and feet.  Pt has known lumbar arthritis.  She went to the hospital about 2 yrs ago for LE spasms and weakness. Her pain has worsened.  She has pain in tailbone which travels down both legs.  Described as burning and stinging. She has difficulty stooping and coming back up. Reaching overhead at work is hard.  Sleep disturbances with pain waking her up at night.  Pain with reaching back, twisting. Sitting increased tailbone pain, if not supported, has to pace a lot. Standing (still)  affects LE pain.  Walking usually improves her pain to a degree.   PERTINENT HISTORY:  Seeing Dietitian for underweight Anxiety  PAIN:  Are you having pain? Yes: NPRS scale: 4/10 Pain location:bilateral thighs Pain description: stinging, burning  Aggravating factors: Sitting increases tailbone pain standing increasing back and leg pain Relieving factors: Lying down, heating pad  PRECAUTIONS: None  RED FLAGS: None   WEIGHT BEARING RESTRICTIONS: No  FALLS:  Has patient fallen in last 6 months? Yes. Number of falls 1, fell in bathroom   LIVING ENVIRONMENT: Lives with: lives with their spouse Lives  in: House/apartment Stairs: No Has following equipment at home: None  OCCUPATION: works part time , [employer]  PLOF: Independent with basic ADLs, Independent with household mobility without device, Independent with community mobility without device, Vocation/Vocational requirements: standing, does not lift at work, and Leisure: family   PATIENT GOALS: I want to have less pain , strengthen my arms and legs   NEXT MD VISIT: As needed  OBJECTIVE:   DIAGNOSTIC FINDINGS:  FINDINGS: Five lumbar type vertebral bodies. There is no evidence of lumbar spine fracture. Straightening of the normal lumbar lordosis. L2-L3 and L3-L4 disc space narrowing with osteophytosis. Lumbar facet hypertrophy. Aortic atherosclerosis.   IMPRESSION: Slight interval increase in lumbar spondylosis most notably at L2-L3 and L3-L4. No acute osseous abnormality.  PATIENT SURVEYS:  FOTO 39%  SCREENING FOR RED FLAGS: Bowel or bladder incontinence: No Spinal tumors: No Cauda equina syndrome: No Compression fracture: No Abdominal aneurysm: No  COGNITION: Overall cognitive status: Within functional limits for tasks assessed     SENSATION: WFL For MUSCLE LENGTH: Hamstrings: Right 45 deg; Left 45 deg  POSTURE: rounded shoulders, forward head, and decreased lumbar lordosis  PALPATION: Pain  centrally L 5 and SI border soreness in bilateral glutes  LUMBAR ROM:   AROM eval  Flexion WNL pain returning   Extension Painful, 25%   Right lateral flexion WNL   Left lateral flexion WNL  Right rotation WNL  Left rotation WNL    (Blank rows = not tested)  LOWER EXTREMITY ROM:   WNL   Active  Right eval Left eval  Hip flexion    Hip extension    Hip abduction    Hip adduction    Hip internal rotation    Hip external rotation    Knee flexion    Knee extension    Ankle dorsiflexion    Ankle plantarflexion    Ankle inversion    Ankle eversion     (Blank rows = not tested)  LOWER EXTREMITY MMT:    MMT Right eval Left eval  Hip flexion 4 4  Hip extension 3+ 3+  Hip abduction 3+ 3+  Hip adduction    Hip internal rotation    Hip external rotation    Knee flexion 4 4-  Knee extension 4+ 4+  Ankle dorsiflexion    Ankle plantarflexion    Ankle inversion    Ankle eversion     (Blank rows = not tested)  LUMBAR SPECIAL TESTS:  Prone instability test: Negative, Straight leg raise test: Negative, Slump test: Negative, Single leg stance test: Negative, and Trendelenburg sign: Negative  FUNCTIONAL TESTS:  5 times sit to stand: 10.8 sec 30 seconds chair stand test  13 reps  05/30/23: 2 MWT 445 feet -at 1: 30 sec pt reports increased weakness in anterior thighs and Right knee began to hyper extend/ decreased control   GAIT: Distance walked: 150 Assistive device utilized: None Level of assistance: SBA Comments: Post session right knee hyperextends, weakness  TODAY'S TREATMENT:  [facility] Adult PT Treatment:                                                DATE: 05/30/23 Therapeutic Exercise: Bridge x 10 HL ball squeeze x 10 Supine march SLR x 5 each  Side hip abduction with knee bent x 10 each Side clam x 10 each Supine QS 5 sec x 10 each   LTR Ab press into ball 5 sec x 10 STS x 10  Wall push up x 10  Therapeutic Activity: 2 MWT -see functional tests for details     DATE: [facility] Adult PT Treatment:                                                DATE: 05/07/23 Therapeutic Exercise: LTR Bridging Sit to stand Not done: hamstring stretch and wall push ups  Self Care: Eval findings      PATIENT EDUCATION:  Education details: HEP, self care, nerve pain , causes of weakness  Person educated: Patient Education method: Explanation, Demonstration, Verbal cues, and Handouts Education comprehension: verbalized understanding, returned demonstration, and needs further education  HOME EXERCISE PROGRAM: Access Code: [email or web address] URL: [email or web address] Date: 05/07/2023 Prepared by: Karie Mainland  Exercises - Supine Lower Trunk Rotation  - 1 x daily - 7 x weekly - 2 sets - 10 reps - 10 hold - Supine Bridge  - 1 x daily - 7 x weekly - 2 sets - 10 reps - 5 hold - Sidelying Hip Abduction  - 1 x daily - 7 x weekly - 2 sets - 10 reps - 5 hold - Seated Hamstring Stretch  - 1 x daily - 7 x weekly - 1 sets - 3-5 reps - 30 hold - Sit to Stand with Counter Support  - 1 x daily - 7 x weekly - 2 sets - 10 reps - 5 hold - Wall Push Up  - 1 x daily - 7 x weekly - 2 sets - 10 reps - 5 hold - Active straight leg raise  - 1 x daily - 7 x weekly - 2 sets - 5 reps  ASSESSMENT:  CLINICAL IMPRESSION: Pt arrives reporting thigh pain today. Captured 2 MWT with pt having progressive tight knee instability, unable to control her hyperextension. Reviewed HEP and progressed with quad activation. Updated HEP. Her muscles were fatigued and the knee instability continued as she left today. Suggested a potential knee brace?    EVAL: Patient is a 57 y.o. female who was seen today for physical therapy evaluation and treatment for low back pain. She does live in [place] and I mentioned [facility] which is closer but she does keep her MD appts in  [place] and prefers to stay here.   OBJECTIVE IMPAIRMENTS: decreased activity tolerance, decreased balance, decreased endurance, decreased mobility, difficulty walking, decreased ROM, decreased strength, increased fascial restrictions, increased muscle spasms, improper body mechanics, postural dysfunction, and pain.   ACTIVITY LIMITATIONS: carrying, lifting, bending, sitting, standing, squatting, sleeping, bed mobility, and locomotion level, reaching overhead   PARTICIPATION LIMITATIONS: cleaning, community activity, and occupation  PERSONAL FACTORS: Time since onset of injury/illness/exacerbation and 1 comorbidity: Anxiety  are also affecting patient's functional outcome.   REHAB POTENTIAL: Excellent  CLINICAL DECISION MAKING: Stable/uncomplicated  EVALUATION COMPLEXITY: Low   GOALS: Goals reviewed with patient? Yes  SHORT TERM GOALS: Target date: 06/04/2023    Pt will be I with initial HEP  Baseline: Given on Goal status: INITIAL  2.  Patient will complete functional testing and set goal Baseline: NT Goal status: INITIAL  3.  Patient will notice improved strength in legs with transfers and post PT Baseline: Legs felt very weak after her first session right knee with hyperextending Goal status: INITIAL   LONG TERM GOALS: Target date: 07/02/2023    Foto score will improve to 47% Baseline: 39% Goal status: INITIAL  2.  Patient will be independent with home exercise program for long-term health Baseline: unknown  Goal status: INITIAL  3.  Patient will be able to sit 30 minutes comfortably for meals with family Baseline: increased sacral pain, severe  Goal status: INITIAL  4.  Patient will be able to demonstrate proper squat mechanics for lifting light items at home  Baseline: needs cues  Goal status: INITIAL  5.  Patient will be able to improve lower extremity strength 5 out of 5 Baseline: see above  Goal status: INITIAL  6.  Patient will be able to reach overhead  without increased back/leg pain for improved home tasks and work Baseline: Increased pain in back  goal status: INITIAL  PLAN:  PT FREQUENCY: 2x/week  PT DURATION: 8 weeks  PLANNED INTERVENTIONS: Therapeutic exercises, Therapeutic activity, Neuromuscular re-education, Balance training, Gait training, Patient/Family education, Self Care, Joint mobilization, Dry Needling, Spinal mobilization, Cryotherapy, Moist heat, Taping, Manual therapy, and Re-evaluation.  PLAN FOR NEXT SESSION: check HPE.  Elease Hashimoto, PTA 06/06/23 11:01 AM Phone: [phone] Fax: [phone]   Check all possible CPT codes: 38756 - PT Re-evaluation, 97110- Therapeutic Exercise, [phone]- Neuro Re-education, 97140 - Manual Therapy, 97530 - Therapeutic Activities, 97535 - Self Care, 97014 - Electrical stimulation (unattended), and 97750 - Physical performance training    Check all conditions that are expected to impact treatment: {Conditions expected to impact treatment:Social determinants of health   If treatment provided at initial evaluation, no treatment charged due to lack of authorization.

## 2023-06-07 ENCOUNTER — Telehealth: Payer: Self-pay | Admitting: Physical Therapy

## 2023-06-07 ENCOUNTER — Encounter: Payer: Self-pay | Admitting: Physical Therapy

## 2023-06-07 NOTE — Telephone Encounter (Signed)
Patient missed her appt yesterday.  Called to remind her of the attendance policy.  She reports she did not feel well all day and then she ended up forgetting about her appt.  She thinks she may be allergic to gabapentin.  She plans to come to her next appt Monday at 3:00 pm.    Karie Mainland, PT 06/07/23 10:32 AM Phone: [phone] Fax: [phone]

## 2023-06-11 ENCOUNTER — Ambulatory Visit: Payer: Medicaid Other | Admitting: Physical Therapy

## 2023-06-11 ENCOUNTER — Encounter: Payer: Self-pay | Admitting: Physical Therapy

## 2023-06-11 DIAGNOSIS — M5459 Other low back pain: Secondary | ICD-10-CM

## 2023-06-11 DIAGNOSIS — M6281 Muscle weakness (generalized): Secondary | ICD-10-CM | POA: Diagnosis not present

## 2023-06-11 NOTE — Therapy (Signed)
OUTPATIENT PHYSICAL THERAPY THORACOLUMBAR TREATMENT    Patient Name: Jeanne Huynh MRN: [id number] DOB:[birth year], 57 y.o., female Today's Date: 06/11/2023  END OF SESSION:  PT End of Session - 06/11/23 1502     Visit Number 3    Number of Visits 16    Date for PT Re-Evaluation 07/02/23    Authorization Type [place] [facility] [employer]    PT Start Time 1500    PT Stop Time 1545    PT Time Calculation (min) 45 min    Activity Tolerance Patient tolerated treatment well;Patient limited by pain    Behavior During Therapy [facility] for tasks assessed/performed              Past Medical History:  Diagnosis Date   Asthma    Enlarged kidney    Kidney stone    History reviewed. No pertinent surgical history. Patient Active Problem List   Diagnosis Date Noted   Renal cyst, left 03/07/2023   Routine screening for STI (sexually transmitted infection) 01/09/2023   Tobacco abuse 01/09/2023   Health care maintenance 01/09/2023   Unintentional weight loss 12/20/2022   Anxiety 12/20/2022   Lumbar back pain 03/07/2021   Does not have health insurance 01/16/2021   Abnormal urine finding 01/16/2021   Leg pain, bilateral 12/23/2020   Pyelonephritis 06/25/2013    PCP: Dr. Bess Kinds REFERRING PROVIDER: Dr. Bess Kinds  REFERRING DIAG: M54.50 (ICD-10-CM) - Lumbar back pain  Rationale for Evaluation and Treatment: Rehabilitation  THERAPY DIAG:  Muscle weakness (generalized)  Other low back pain  ONSET DATE: chronic   SUBJECTIVE:                                                                                                                                                                                           SUBJECTIVE STATEMENT: Pt continues to have back and leg pain, more intermittent and less severe.  She still says much of it had to do with the medicine she was on (Gabapentin) she still feels LE weakness.   EVAL: I have bad back pain. My legs are so weak and  they don't know why.  Legs lock up , spasms in legs and feet.  Pt has known lumbar arthritis.  She went to the hospital about 2 yrs ago for LE spasms and weakness. Her pain has worsened.  She has pain in tailbone which travels down both legs.  Described as burning and stinging. She has difficulty stooping and coming back up. Reaching overhead at work is hard.  Sleep disturbances with pain waking her up at night.  Pain with reaching back, twisting. Sitting increased  tailbone pain, if not supported, has to pace a lot. Standing (still) affects LE pain.  Walking usually improves her pain to a degree.   PERTINENT HISTORY:  Seeing Dietitian for underweight Anxiety  PAIN:  Are you having pain? Yes: NPRS scale: 4/10 Pain location:bilateral thighs Pain description: stinging, burning  Aggravating factors: Sitting increases tailbone pain standing increasing back and leg pain Relieving factors: Lying down, heating pad  PRECAUTIONS: None  RED FLAGS: None   WEIGHT BEARING RESTRICTIONS: No  FALLS:  Has patient fallen in last 6 months? Yes. Number of falls 1, fell in bathroom   LIVING ENVIRONMENT: Lives with: lives with their spouse Lives in: House/apartment Stairs: No Has following equipment at home: None  OCCUPATION: works part time , [employer]  PLOF: Independent with basic ADLs, Independent with household mobility without device, Independent with community mobility without device, Vocation/Vocational requirements: standing, does not lift at work, and Leisure: family   PATIENT GOALS: I want to have less pain , strengthen my arms and legs   NEXT MD VISIT: As needed  OBJECTIVE:   DIAGNOSTIC FINDINGS:  FINDINGS: Five lumbar type vertebral bodies. There is no evidence of lumbar spine fracture. Straightening of the normal lumbar lordosis. L2-L3 and L3-L4 disc space narrowing with osteophytosis. Lumbar facet hypertrophy. Aortic atherosclerosis.   IMPRESSION: Slight interval increase  in lumbar spondylosis most notably at L2-L3 and L3-L4. No acute osseous abnormality.  PATIENT SURVEYS:  FOTO 39%  SCREENING FOR RED FLAGS: Bowel or bladder incontinence: No Spinal tumors: No Cauda equina syndrome: No Compression fracture: No Abdominal aneurysm: No  COGNITION: Overall cognitive status: Within functional limits for tasks assessed     SENSATION: WFL For MUSCLE LENGTH: Hamstrings: Right 45 deg; Left 45 deg  POSTURE: rounded shoulders, forward head, and decreased lumbar lordosis  PALPATION: Pain centrally L 5 and SI border soreness in bilateral glutes  LUMBAR ROM:   AROM eval  Flexion WNL pain returning   Extension Painful, 25%   Right lateral flexion WNL   Left lateral flexion WNL  Right rotation WNL  Left rotation WNL    (Blank rows = not tested)  LOWER EXTREMITY ROM:   WNL   Active  Right eval Left eval  Hip flexion    Hip extension    Hip abduction    Hip adduction    Hip internal rotation    Hip external rotation    Knee flexion    Knee extension    Ankle dorsiflexion    Ankle plantarflexion    Ankle inversion    Ankle eversion     (Blank rows = not tested)  LOWER EXTREMITY MMT:    MMT Right eval Left eval  Hip flexion 4 4  Hip extension 3+ 3+  Hip abduction 3+ 3+  Hip adduction    Hip internal rotation    Hip external rotation    Knee flexion 4 4-  Knee extension 4+ 4+  Ankle dorsiflexion    Ankle plantarflexion    Ankle inversion    Ankle eversion     (Blank rows = not tested)  LUMBAR SPECIAL TESTS:  Prone instability test: Negative, Straight leg raise test: Negative, Slump test: Negative, Single leg stance test: Negative, and Trendelenburg sign: Negative  FUNCTIONAL TESTS:  5 times sit to stand: 10.8 sec 30 seconds chair stand test  13 reps  05/30/23: 2 MWT 445 feet -at 1: 30 sec pt reports increased weakness in anterior thighs and Right knee began to hyper  extend/ decreased control   GAIT: Distance walked:  150 Assistive device utilized: None Level of assistance: SBA Comments: Post session right knee hyperextends, weakness  TODAY'S TREATMENT:       [facility] Adult PT Treatment:                                                DATE: 06/11/23 Therapeutic Exercise: Supine Lower Trunk Rotation  x 10 Supine Bridge x 10 x 2 , green band for 2nd set  SLR x 15 x 2  Sidelying Hip Abduction x 10  Sit to Stand 2 x 10, no UEs , 1 set with 10 lbs  Heel raise x 15  Hip extension x 10  Hip abduction x 10  Wall sit 10 sec x 5  Wall push up x 10  Supine green band ABD/ER x 10, bilateral then unilateral  Knee to chest Hamstring stretch   Self Care: Heel lift in L insole and explanation, measurement of leg length discrepancy                                                                                                                        [facility] Adult PT Treatment:                                                DATE: 05/30/23 Therapeutic Exercise: Bridge x 10 HL ball squeeze x 10 Supine march SLR x 5 each  Side hip abduction with knee bent x 10 each Side clam x 10 each Supine QS 5 sec x 10 each  LTR Ab press into ball 5 sec x 10 STS x 10  Wall push up x 10  Therapeutic Activity: 2 MWT -see functional tests for details     DATE: [facility] Adult PT Treatment:                                                DATE: 05/07/23 Therapeutic Exercise: LTR Bridging Sit to stand Not done: hamstring stretch and wall push ups  Self Care: Eval findings      PATIENT EDUCATION:  Education details: HEP, self care, nerve pain , causes of weakness  Person educated: Patient Education method: Explanation, Demonstration, Verbal cues, and Handouts Education comprehension: verbalized understanding, returned demonstration, and needs further education  HOME EXERCISE PROGRAM: Access Code: [email or web address] URL: [email or web address] Date: 06/11/2023 Prepared by: Karie Mainland  Exercises - Supine Lower Trunk  Rotation  - 1 x daily - 7 x weekly - 2 sets - 10 reps - 10 hold - Sidelying Hip Abduction  -  1 x daily - 7 x weekly - 2 sets - 10 reps - 5 hold - Seated Hamstring Stretch  - 1 x daily - 7 x weekly - 1 sets - 3-5 reps - 30 hold - Sit to Stand with Counter Support  - 1 x daily - 7 x weekly - 2 sets - 10 reps - 5 hold - Wall Push Up  - 1 x daily - 7 x weekly - 2 sets - 10 reps - 5 hold - Active straight leg raise  - 1 x daily - 7 x weekly - 2 sets - 5 reps - Supine Bridge with Resistance Band  - 1 x daily - 7 x weekly - 2 sets - 10 reps - 5 hold - Wall Quarter Squat  - 1 x daily - 7 x weekly - 1 sets - 5-10 reps - 10 hold   ASSESSMENT:  CLINICAL IMPRESSION:   Pt continues to experience LE weakness but pain overall minimal today. She will not be seeing her doctor until November, may consider a knee brace for Rt knee.  Pt tolerated session of strengthening well but did show increased muscle fatigue with all LE exercises. She had a Rt iliac crest  and Rt leg longer by about 2 cm. Added low heel lift to level pelvis to see if back pain can be impacted.    OBJECTIVE IMPAIRMENTS: decreased activity tolerance, decreased balance, decreased endurance, decreased mobility, difficulty walking, decreased ROM, decreased strength, increased fascial restrictions, increased muscle spasms, improper body mechanics, postural dysfunction, and pain.   ACTIVITY LIMITATIONS: carrying, lifting, bending, sitting, standing, squatting, sleeping, bed mobility, and locomotion level, reaching overhead   PARTICIPATION LIMITATIONS: cleaning, community activity, and occupation  PERSONAL FACTORS: Time since onset of injury/illness/exacerbation and 1 comorbidity: Anxiety  are also affecting patient's functional outcome.   REHAB POTENTIAL: Excellent  CLINICAL DECISION MAKING: Stable/uncomplicated  EVALUATION COMPLEXITY: Low   GOALS: Goals reviewed with patient? Yes  SHORT TERM GOALS: Target date: 06/04/2023    Pt will  be I with initial HEP  Baseline: Given on Goal status: ongoing   2.  Patient will complete functional testing and set goal Baseline: 2 min walk tet see above, 445 feet  Goal status:met   3.  Patient will notice improved strength in legs with transfers and post PT Baseline: Legs felt very weak after her first session right knee with hyperextending Goal status: ongoing    LONG TERM GOALS: Target date: 07/02/2023    Foto score will improve to 47% Baseline: 39% Goal status: ongoing   2.  Patient will be independent with home exercise program for long-term health Baseline: unknown  Goal status:ongoing   3.  Patient will be able to sit 30 minutes comfortably for meals with family Baseline: increased sacral pain, severe  Goal status:ongoing   4.  Patient will be able to demonstrate proper squat mechanics for lifting light items at home  Baseline: needs cues  Goal status: ongoing   5.  Patient will be able to improve lower extremity strength 5 out of 5 Baseline: see above  Goal status: ongoing   6.  Patient will be able to reach overhead without increased back/leg pain for improved home tasks and work Baseline: Increased pain in back  goal status: ongoing   PLAN:  PT FREQUENCY: 2x/week  PT DURATION: 8 weeks  PLANNED INTERVENTIONS: Therapeutic exercises, Therapeutic activity, Neuromuscular re-education, Balance training, Gait training, Patient/Family education, Self Care, Joint mobilization, Dry Needling, Spinal mobilization,  Cryotherapy, Moist heat, Taping, Manual therapy, and Re-evaluation.  PLAN FOR NEXT SESSION: check HEP, nuStep, UBE. LE strength as tolerated    Karie Mainland, PT 06/11/23 3:54 PM Phone: [phone] Fax: [phone]

## 2023-06-12 ENCOUNTER — Other Ambulatory Visit: Payer: Self-pay | Admitting: Student

## 2023-06-13 ENCOUNTER — Ambulatory Visit: Payer: Medicaid Other | Admitting: Physical Therapy

## 2023-06-20 ENCOUNTER — Encounter: Payer: Self-pay | Admitting: Physical Therapy

## 2023-06-20 ENCOUNTER — Ambulatory Visit: Payer: Medicaid Other | Attending: Family Medicine | Admitting: Physical Therapy

## 2023-06-20 DIAGNOSIS — M5459 Other low back pain: Secondary | ICD-10-CM | POA: Diagnosis not present

## 2023-06-20 DIAGNOSIS — M6281 Muscle weakness (generalized): Secondary | ICD-10-CM | POA: Insufficient documentation

## 2023-06-20 NOTE — Therapy (Signed)
OUTPATIENT PHYSICAL THERAPY THORACOLUMBAR TREATMENT    Patient Name: Jeanne Huynh MRN: [id number] DOB:1966/05/15, 57 y.o., female Today's Date: 06/20/2023  END OF SESSION:  PT End of Session - 06/20/23 1512     Visit Number 4    Number of Visits 16    Date for PT Re-Evaluation 07/02/23    Authorization Type [place] [employer]    PT Start Time 1505    PT Stop Time 1545    PT Time Calculation (min) 40 min    Activity Tolerance Patient tolerated treatment well;Patient limited by pain    Behavior During Therapy [facility] for tasks assessed/performed               Past Medical History:  Diagnosis Date   Asthma    Enlarged kidney    Kidney stone    History reviewed. No pertinent surgical history. Patient Active Problem List   Diagnosis Date Noted   Renal cyst, left 03/07/2023   Routine screening for STI (sexually transmitted infection) 01/09/2023   Tobacco abuse 01/09/2023   Health care maintenance 01/09/2023   Unintentional weight loss 12/20/2022   Anxiety 12/20/2022   Lumbar back pain 03/07/2021   Does not have health insurance 01/16/2021   Abnormal urine finding 01/16/2021   Leg pain, bilateral 12/23/2020   Pyelonephritis 06/25/2013    PCP: Dr. Bess Kinds REFERRING PROVIDER: Dr. Bess Kinds  REFERRING DIAG: M54.50 (ICD-10-CM) - Lumbar back pain  Rationale for Evaluation and Treatment: Rehabilitation  THERAPY DIAG:  Muscle weakness (generalized)  Other low back pain  ONSET DATE: chronic   SUBJECTIVE:                                                                                                                                                                                           SUBJECTIVE STATEMENT: Pt reports no tailbone pain! BUT has L calf pain maybe due to the heel lift. Knees feel weak and painful.  Knot in her L arch.    EVAL: I have bad back pain. My legs are so weak and they don't know why.  Legs lock up , spasms in legs and  feet.  Pt has known lumbar arthritis.  She went to the hospital about 2 yrs ago for LE spasms and weakness. Her pain has worsened.  She has pain in tailbone which travels down both legs.  Described as burning and stinging. She has difficulty stooping and coming back up. Reaching overhead at work is hard.  Sleep disturbances with pain waking her up at night.  Pain with reaching back, twisting. Sitting increased tailbone pain, if not supported, has  to pace a lot. Standing (still) affects LE pain.  Walking usually improves her pain to a degree.   PERTINENT HISTORY:  Seeing Dietitian for underweight Anxiety  PAIN:  Are you having pain? Yes: NPRS scale: 7/10 Pain location:L calf Pain description: stinging, burning  Aggravating factors: Sitting increases tailbone pain standing increasing back and leg pain Relieving factors: Lying down, heating pad  PRECAUTIONS: None  RED FLAGS: None   WEIGHT BEARING RESTRICTIONS: No  FALLS:  Has patient fallen in last 6 months? Yes. Number of falls 1, fell in bathroom   LIVING ENVIRONMENT: Lives with: lives with their spouse Lives in: House/apartment Stairs: No Has following equipment at home: None  OCCUPATION: works part time , [employer]  PLOF: Independent with basic ADLs, Independent with household mobility without device, Independent with community mobility without device, Vocation/Vocational requirements: standing, does not lift at work, and Leisure: family   PATIENT GOALS: I want to have less pain , strengthen my arms and legs   NEXT MD VISIT: As needed  OBJECTIVE:   DIAGNOSTIC FINDINGS:  FINDINGS: Five lumbar type vertebral bodies. There is no evidence of lumbar spine fracture. Straightening of the normal lumbar lordosis. L2-L3 and L3-L4 disc space narrowing with osteophytosis. Lumbar facet hypertrophy. Aortic atherosclerosis.   IMPRESSION: Slight interval increase in lumbar spondylosis most notably at L2-L3 and L3-L4. No acute  osseous abnormality.  PATIENT SURVEYS:  FOTO 39%  SCREENING FOR RED FLAGS: Bowel or bladder incontinence: No Spinal tumors: No Cauda equina syndrome: No Compression fracture: No Abdominal aneurysm: No  COGNITION: Overall cognitive status: Within functional limits for tasks assessed     SENSATION: WFL For MUSCLE LENGTH: Hamstrings: Right 45 deg; Left 45 deg  POSTURE: rounded shoulders, forward head, and decreased lumbar lordosis  PALPATION: Pain centrally L 5 and SI border soreness in bilateral glutes  LUMBAR ROM:   AROM eval  Flexion WNL pain returning   Extension Painful, 25%   Right lateral flexion WNL   Left lateral flexion WNL  Right rotation WNL  Left rotation WNL    (Blank rows = not tested)  LOWER EXTREMITY ROM:   WNL   Active  Right eval Left eval  Hip flexion    Hip extension    Hip abduction    Hip adduction    Hip internal rotation    Hip external rotation    Knee flexion    Knee extension    Ankle dorsiflexion    Ankle plantarflexion    Ankle inversion    Ankle eversion     (Blank rows = not tested)  LOWER EXTREMITY MMT:    MMT Right eval Left eval  Hip flexion 4 4  Hip extension 3+ 3+  Hip abduction 3+ 3+  Hip adduction    Hip internal rotation    Hip external rotation    Knee flexion 4 4-  Knee extension 4+ 4+  Ankle dorsiflexion    Ankle plantarflexion    Ankle inversion    Ankle eversion     (Blank rows = not tested)  LUMBAR SPECIAL TESTS:  Prone instability test: Negative, Straight leg raise test: Negative, Slump test: Negative, Single leg stance test: Negative, and Trendelenburg sign: Negative  FUNCTIONAL TESTS:  5 times sit to stand: 10.8 sec 30 seconds chair stand test  13 reps  05/30/23: 2 MWT 445 feet -at 1: 30 sec pt reports increased weakness in anterior thighs and Right knee began to hyper extend/ decreased control   GAIT:  Distance walked: 150 Assistive device utilized: None Level of assistance:  SBA Comments: Post session right knee hyperextends, weakness  TODAY'S TREATMENT:       [facility] Adult PT Treatment:                                                DATE: 06/20/23 Therapeutic Exercise: NuStep L 5 UE and LE for 5 min  Calf stretch on wall each side x 3, 30 sec  Heel raise off step x 15 Calf stretch off step LLE x 3  Hamstring stretch L x 2 each 30 sec  Banded bridge green band  x 10  Manual Therapy: IASTM to L calf and arch of foot in prone Soft tissue to gastroc, lateral more tender Moderate pressure applied to plantar fascia with knee flexed and ankle dorsiflexed gentle friction  [facility] Adult PT Treatment:                                                DATE: 06/11/23 Therapeutic Exercise: Supine Lower Trunk Rotation  x 10 Supine Bridge x 10 x 2 , green band for 2nd set  SLR x 15 x 2  Sidelying Hip Abduction x 10  Sit to Stand 2 x 10, no UEs , 1 set with 10 lbs  Heel raise x 15  Hip extension x 10  Hip abduction x 10  Wall sit 10 sec x 5  Wall push up x 10  Supine green band ABD/ER x 10, bilateral then unilateral  Knee to chest Hamstring stretch   Self Care: Heel lift in L insole and explanation, measurement of leg length discrepancy                                                                                                                        [facility] Adult PT Treatment:                                                DATE: 05/30/23 Therapeutic Exercise: Bridge x 10 HL ball squeeze x 10 Supine march SLR x 5 each  Side hip abduction with knee bent x 10 each Side clam x 10 each Supine QS 5 sec x 10 each  LTR Ab press into ball 5 sec x 10 STS x 10  Wall push up x 10  Therapeutic Activity: 2 MWT -see functional tests for details     DATE: [facility] Adult PT Treatment:  DATE: 05/07/23 Therapeutic Exercise: LTR Bridging Sit to stand Not done: hamstring stretch and wall push ups  Self Care: Eval findings       PATIENT EDUCATION:  Education details: HEP, self care, nerve pain , causes of weakness  Person educated: Patient Education method: Explanation, Demonstration, Verbal cues, and Handouts Education comprehension: verbalized understanding, returned demonstration, and needs further education  HOME EXERCISE PROGRAM: Access Code: [email or web address] URL: [email or web address] Date: 06/11/2023 Prepared by: Karie Mainland  Exercises - Supine Lower Trunk Rotation  - 1 x daily - 7 x weekly - 2 sets - 10 reps - 10 hold - Sidelying Hip Abduction  - 1 x daily - 7 x weekly - 2 sets - 10 reps - 5 hold - Seated Hamstring Stretch  - 1 x daily - 7 x weekly - 1 sets - 3-5 reps - 30 hold - Sit to Stand with Counter Support  - 1 x daily - 7 x weekly - 2 sets - 10 reps - 5 hold - Wall Push Up  - 1 x daily - 7 x weekly - 2 sets - 10 reps - 5 hold - Active straight leg raise  - 1 x daily - 7 x weekly - 2 sets - 5 reps - Supine Bridge with Resistance Band  - 1 x daily - 7 x weekly - 2 sets - 10 reps - 5 hold - Wall Quarter Squat  - 1 x daily - 7 x weekly - 1 sets - 5-10 reps - 10 hold   ASSESSMENT:  CLINICAL IMPRESSION:   Patient reports benefit from the left heel lift which was given at the last session.  She reports less pain overall in her low back and tailbone.  However she has developed increased tightness and cramping in her left calf as a result provided home program for stretching calf as well as manual therapy.  This relieved the pain as she left the clinic.  Patient will continue to benefit from lower body strengthening and stretching program.  Advised her to take the heel lift out if the cramping became too intense, but she was encouraged that her back pain was better.    OBJECTIVE IMPAIRMENTS: decreased activity tolerance, decreased balance, decreased endurance, decreased mobility, difficulty walking, decreased ROM, decreased strength, increased fascial restrictions, increased muscle spasms,  improper body mechanics, postural dysfunction, and pain.   ACTIVITY LIMITATIONS: carrying, lifting, bending, sitting, standing, squatting, sleeping, bed mobility, and locomotion level, reaching overhead   PARTICIPATION LIMITATIONS: cleaning, community activity, and occupation  PERSONAL FACTORS: Time since onset of injury/illness/exacerbation and 1 comorbidity: Anxiety  are also affecting patient's functional outcome.   REHAB POTENTIAL: Excellent  CLINICAL DECISION MAKING: Stable/uncomplicated  EVALUATION COMPLEXITY: Low   GOALS: Goals reviewed with patient? Yes  SHORT TERM GOALS: Target date: 06/04/2023    Pt will be I with initial HEP  Baseline: Given on Goal status: ongoing   2.  Patient will complete functional testing and set goal Baseline: 2 min walk tet see above, 445 feet  Goal status:met   3.  Patient will notice improved strength in legs with transfers and post PT Baseline: Legs felt very weak after her first session right knee with hyperextending Goal status: ongoing    LONG TERM GOALS: Target date: 07/02/2023    Foto score will improve to 47% Baseline: 39% Goal status: ongoing   2.  Patient will be independent with home exercise program for long-term health Baseline: unknown  Goal status:ongoing   3.  Patient will be able to sit 30 minutes comfortably for meals with family Baseline: increased sacral pain, severe  Goal status:ongoing   4.  Patient will be able to demonstrate proper squat mechanics for lifting light items at home  Baseline: needs cues  Goal status: ongoing   5.  Patient will be able to improve lower extremity strength 5 out of 5 Baseline: see above  Goal status: ongoing   6.  Patient will be able to reach overhead without increased back/leg pain for improved home tasks and work Baseline: Increased pain in back  goal status: ongoing   PLAN:  PT FREQUENCY: 2x/week  PT DURATION: 8 weeks  PLANNED INTERVENTIONS: Therapeutic  exercises, Therapeutic activity, Neuromuscular re-education, Balance training, Gait training, Patient/Family education, Self Care, Joint mobilization, Dry Needling, Spinal mobilization, Cryotherapy, Moist heat, Taping, Manual therapy, and Re-evaluation.  PLAN FOR NEXT SESSION: check HEP, nuStep, UBE. LE strength as tolerated    Karie Mainland, PT 06/20/23 6:19 PM Phone: [phone] Fax: [phone]

## 2023-06-24 NOTE — Therapy (Signed)
OUTPATIENT PHYSICAL THERAPY THORACOLUMBAR TREATMENT    Patient Name: Jeanne Huynh MRN: [id number] DOB:1966-05-24, 57 y.o., female Today's Date: 06/25/2023  END OF SESSION:  PT End of Session - 06/25/23 0944     Visit Number 5    Number of Visits 16    Date for PT Re-Evaluation 07/02/23    Authorization Type [place] [employer]    PT Start Time [phone]    PT Stop Time 1017    PT Time Calculation (min) 38 min    Activity Tolerance Patient tolerated treatment well;Patient limited by pain    Behavior During Therapy [facility] for tasks assessed/performed                Past Medical History:  Diagnosis Date   Asthma    Enlarged kidney    Kidney stone    History reviewed. No pertinent surgical history. Patient Active Problem List   Diagnosis Date Noted   Renal cyst, left 03/07/2023   Routine screening for STI (sexually transmitted infection) 01/09/2023   Tobacco abuse 01/09/2023   Health care maintenance 01/09/2023   Unintentional weight loss 12/20/2022   Anxiety 12/20/2022   Lumbar back pain 03/07/2021   Does not have health insurance 01/16/2021   Abnormal urine finding 01/16/2021   Leg pain, bilateral 12/23/2020   Pyelonephritis 06/25/2013    PCP: Dr. Bess Kinds REFERRING PROVIDER: Dr. Bess Kinds  REFERRING DIAG: M54.50 (ICD-10-CM) - Lumbar back pain  Rationale for Evaluation and Treatment: Rehabilitation  THERAPY DIAG:  Muscle weakness (generalized)  Other low back pain  ONSET DATE: chronic   SUBJECTIVE:                                                                                                                                                                                           SUBJECTIVE STATEMENT: Leg feels weak.  Less calf pain  and min low back pain.    EVAL: I have bad back pain. My legs are so weak and they don't know why.  Legs lock up , spasms in legs and feet.  Pt has known lumbar arthritis.  She went to the hospital  about 2 yrs ago for LE spasms and weakness. Her pain has worsened.  She has pain in tailbone which travels down both legs.  Described as burning and stinging. She has difficulty stooping and coming back up. Reaching overhead at work is hard.  Sleep disturbances with pain waking her up at night.  Pain with reaching back, twisting. Sitting increased tailbone pain, if not supported, has to pace a lot. Standing (still) affects LE pain.  Walking usually improves  her pain to a degree.   PERTINENT HISTORY:  Seeing Dietitian for underweight Anxiety  PAIN:  Are you having pain? Yes: NPRS scale: 2/10 Pain location:back  Pain description: stinging, burning  Aggravating factors: Sitting increases tailbone pain standing increasing back and leg pain Relieving factors: Lying down, heating pad  PRECAUTIONS: None  RED FLAGS: None   WEIGHT BEARING RESTRICTIONS: No  FALLS:  Has patient fallen in last 6 months? Yes. Number of falls 1, fell in bathroom   LIVING ENVIRONMENT: Lives with: lives with their spouse Lives in: House/apartment Stairs: No Has following equipment at home: None  OCCUPATION: works part time , [employer]  PLOF: Independent with basic ADLs, Independent with household mobility without device, Independent with community mobility without device, Vocation/Vocational requirements: standing, does not lift at work, and Leisure: family   PATIENT GOALS: I want to have less pain , strengthen my arms and legs   NEXT MD VISIT: As needed  OBJECTIVE:   DIAGNOSTIC FINDINGS:  FINDINGS: Five lumbar type vertebral bodies. There is no evidence of lumbar spine fracture. Straightening of the normal lumbar lordosis. L2-L3 and L3-L4 disc space narrowing with osteophytosis. Lumbar facet hypertrophy. Aortic atherosclerosis.   IMPRESSION: Slight interval increase in lumbar spondylosis most notably at L2-L3 and L3-L4. No acute osseous abnormality.  PATIENT SURVEYS:  FOTO 39%  SCREENING  FOR RED FLAGS: Bowel or bladder incontinence: No Spinal tumors: No Cauda equina syndrome: No Compression fracture: No Abdominal aneurysm: No  COGNITION: Overall cognitive status: Within functional limits for tasks assessed     SENSATION: WFL For MUSCLE LENGTH: Hamstrings: Right 45 deg; Left 45 deg  POSTURE: rounded shoulders, forward head, and decreased lumbar lordosis  PALPATION: Pain centrally L 5 and SI border soreness in bilateral glutes  LUMBAR ROM:   AROM eval  Flexion WNL pain returning   Extension Painful, 25%   Right lateral flexion WNL   Left lateral flexion WNL  Right rotation WNL  Left rotation WNL    (Blank rows = not tested)  LOWER EXTREMITY ROM:   WNL   Active  Right eval Left eval  Hip flexion    Hip extension    Hip abduction    Hip adduction    Hip internal rotation    Hip external rotation    Knee flexion    Knee extension    Ankle dorsiflexion    Ankle plantarflexion    Ankle inversion    Ankle eversion     (Blank rows = not tested)  LOWER EXTREMITY MMT:    MMT Right eval Left eval  Hip flexion 4 4  Hip extension 3+ 3+  Hip abduction 3+ 3+  Hip adduction    Hip internal rotation    Hip external rotation    Knee flexion 4 4-  Knee extension 4+ 4+  Ankle dorsiflexion    Ankle plantarflexion    Ankle inversion    Ankle eversion     (Blank rows = not tested)  LUMBAR SPECIAL TESTS:  Prone instability test: Negative, Straight leg raise test: Negative, Slump test: Negative, Single leg stance test: Negative, and Trendelenburg sign: Negative  FUNCTIONAL TESTS:  5 times sit to stand: 10.8 sec 30 seconds chair stand test  13 reps  05/30/23: 2 MWT 445 feet -at 1: 30 sec pt reports increased weakness in anterior thighs and Right knee began to hyper extend/ decreased control   GAIT: Distance walked: 150 Assistive device utilized: None Level of assistance: SBA Comments: Post  session right knee hyperextends, weakness  TODAY'S  TREATMENT:        [facility] Adult PT Treatment:                                                DATE: 06/25/23 Therapeutic Exercise: NuStep L6 UE and LE for 5 min  Step ups 6 inch with UE assist (added opp leg hip flex) Step up 6 inch no UEs  x 10  Wall squat 1/3 with 5 sec hold  x 10  Calf stretch 30 sec x 3 on wall each leg  Banded knee extension red x 12  Ball squeeze with bridge x 10  Bridge with band  Ball squeeze with knee extension x 10  Bridge with clam band red x 15 March red band x 10  Q-ped cat and camel Hip extension -unable - mod to prone with pillow  Bent knee prone extension x 10 each   [facility] Adult PT Treatment:                                                DATE: 06/20/23 Therapeutic Exercise: NuStep L 5 UE and LE for 5 min  Calf stretch on wall each side x 3, 30 sec  Heel raise off step x 15 Calf stretch off step LLE x 3  Hamstring stretch L x 2 each 30 sec  Banded bridge green band  x 10  Manual Therapy: IASTM to L calf and arch of foot in prone Soft tissue to gastroc, lateral more tender Moderate pressure applied to plantar fascia with knee flexed and ankle dorsiflexed gentle friction  [facility] Adult PT Treatment:                                                DATE: 06/11/23 Therapeutic Exercise: Supine Lower Trunk Rotation  x 10 Supine Bridge x 10 x 2 , green band for 2nd set  SLR x 15 x 2  Sidelying Hip Abduction x 10  Sit to Stand 2 x 10, no UEs , 1 set with 10 lbs  Heel raise x 15  Hip extension x 10  Hip abduction x 10  Wall sit 10 sec x 5  Wall push up x 10  Supine green band ABD/ER x 10, bilateral then unilateral  Knee to chest Hamstring stretch   Self Care: Heel lift in L insole and explanation, measurement of leg length discrepancy  [facility] Adult PT Treatment:                                                DATE: 05/30/23 Therapeutic  Exercise: Bridge x 10 HL ball squeeze x 10 Supine march SLR x 5 each  Side hip abduction with knee bent x 10 each Side clam x 10 each Supine QS 5 sec x 10 each  LTR Ab press into ball 5 sec x 10 STS x 10  Wall push up x 10  Therapeutic Activity: 2 MWT -see functional tests for details     DATE: [facility] Adult PT Treatment:                                                DATE: 05/07/23 Therapeutic Exercise: LTR Bridging Sit to stand Not done: hamstring stretch and wall push ups  Self Care: Eval findings      PATIENT EDUCATION:  Education details: HEP, self care, nerve pain , causes of weakness  Person educated: Patient Education method: Explanation, Demonstration, Verbal cues, and Handouts Education comprehension: verbalized understanding, returned demonstration, and needs further education  HOME EXERCISE PROGRAM: Access Code: [email or web address] URL: [email or web address] Date: 06/11/2023 Prepared by: Karie Mainland  Exercises - Supine Lower Trunk Rotation  - 1 x daily - 7 x weekly - 2 sets - 10 reps - 10 hold - Sidelying Hip Abduction  - 1 x daily - 7 x weekly - 2 sets - 10 reps - 5 hold - Seated Hamstring Stretch  - 1 x daily - 7 x weekly - 1 sets - 3-5 reps - 30 hold - Sit to Stand with Counter Support  - 1 x daily - 7 x weekly - 2 sets - 10 reps - 5 hold - Wall Push Up  - 1 x daily - 7 x weekly - 2 sets - 10 reps - 5 hold - Active straight leg raise  - 1 x daily - 7 x weekly - 2 sets - 5 reps - Supine Bridge with Resistance Band  - 1 x daily - 7 x weekly - 2 sets - 10 reps - 5 hold - Wall Quarter Squat  - 1 x daily - 7 x weekly - 1 sets - 5-10 reps - 10 hold   ASSESSMENT:  CLINICAL IMPRESSION: Focused session on LE strength today.  She is mod I with HEP (needs paper) .  She reports a feeling of a shift in her tailbone with certain supine exercises.  Modified session for comfort and ease, difficulty in quadruped. Pt will cont to benefit from skilled PT to further  improve strengthen core, hips.     OBJECTIVE IMPAIRMENTS: decreased activity tolerance, decreased balance, decreased endurance, decreased mobility, difficulty walking, decreased ROM, decreased strength, increased fascial restrictions, increased muscle spasms, improper body mechanics, postural dysfunction, and pain.   ACTIVITY LIMITATIONS: carrying, lifting, bending, sitting, standing, squatting, sleeping, bed mobility, and locomotion level, reaching overhead   PARTICIPATION LIMITATIONS: cleaning, community activity, and occupation  PERSONAL FACTORS: Time since onset of injury/illness/exacerbation and 1 comorbidity: Anxiety  are also affecting patient's functional outcome.   REHAB POTENTIAL: Excellent  CLINICAL DECISION MAKING: Stable/uncomplicated  EVALUATION COMPLEXITY: Low   GOALS: Goals reviewed with patient?  Yes  SHORT TERM GOALS: Target date: 06/04/2023    Pt will be I with initial HEP  Baseline: Given on Goal status: met   2.  Patient will complete functional testing and set goal Baseline: 2 min walk tet see above, 445 feet  Goal status: met   3.  Patient will notice improved strength in legs with transfers and post PT Baseline: Legs felt very weak after her first session right knee with hyperextending Goal status: ongoing    LONG TERM GOALS: Target date: 07/02/2023    Foto score will improve to 47% Baseline: 39% Goal status: ongoing   2.  Patient will be independent with home exercise program for long-term health Baseline: unknown  Goal status:ongoing   3.  Patient will be able to sit 30 minutes comfortably for meals with family Baseline: increased sacral pain, severe  Goal status:ongoing   4.  Patient will be able to demonstrate proper squat mechanics for lifting light items at home  Baseline: needs cues  Goal status: ongoing   5.  Patient will be able to improve lower extremity strength 5 out of 5 Baseline: see above  Goal status: ongoing   6.   Patient will be able to reach overhead without increased back/leg pain for improved home tasks and work Baseline: Increased pain in back  goal status: ongoing   PLAN:  PT FREQUENCY: 2x/week  PT DURATION: 8 weeks  PLANNED INTERVENTIONS: Therapeutic exercises, Therapeutic activity, Neuromuscular re-education, Balance training, Gait training, Patient/Family education, Self Care, Joint mobilization, Dry Needling, Spinal mobilization, Cryotherapy, Moist heat, Taping, Manual therapy, and Re-evaluation.  PLAN FOR NEXT SESSION:   Karie Mainland, PT 06/25/23 10:13 AM Phone: [phone] Fax: [phone]

## 2023-06-25 ENCOUNTER — Encounter: Payer: Self-pay | Admitting: Physical Therapy

## 2023-06-25 ENCOUNTER — Ambulatory Visit: Payer: Medicaid Other | Admitting: Physical Therapy

## 2023-06-25 DIAGNOSIS — M6281 Muscle weakness (generalized): Secondary | ICD-10-CM | POA: Diagnosis not present

## 2023-06-25 DIAGNOSIS — M5459 Other low back pain: Secondary | ICD-10-CM

## 2023-07-02 ENCOUNTER — Encounter: Payer: Medicaid Other | Admitting: Physical Therapy

## 2023-07-10 ENCOUNTER — Encounter: Payer: Self-pay | Admitting: Student

## 2023-07-10 ENCOUNTER — Ambulatory Visit: Payer: Medicaid Other

## 2023-07-10 ENCOUNTER — Ambulatory Visit: Payer: Medicaid Other | Admitting: Student

## 2023-07-10 VITALS — BP 146/87 | HR 78 | Ht 65.0 in | Wt 101.8 lb

## 2023-07-10 DIAGNOSIS — M79604 Pain in right leg: Secondary | ICD-10-CM

## 2023-07-10 DIAGNOSIS — M79605 Pain in left leg: Secondary | ICD-10-CM

## 2023-07-10 DIAGNOSIS — R634 Abnormal weight loss: Secondary | ICD-10-CM | POA: Diagnosis not present

## 2023-07-10 DIAGNOSIS — M5459 Other low back pain: Secondary | ICD-10-CM

## 2023-07-10 DIAGNOSIS — M545 Low back pain, unspecified: Secondary | ICD-10-CM

## 2023-07-10 DIAGNOSIS — Z1211 Encounter for screening for malignant neoplasm of colon: Secondary | ICD-10-CM | POA: Diagnosis not present

## 2023-07-10 DIAGNOSIS — M6281 Muscle weakness (generalized): Secondary | ICD-10-CM

## 2023-07-10 DIAGNOSIS — M6740 Ganglion, unspecified site: Secondary | ICD-10-CM

## 2023-07-10 NOTE — Assessment & Plan Note (Addendum)
Patient comes in for follow-up of her weight loss.  Patient has been attempting to gain weight.  Patient appreciates she has been snacking more frequently, drinking Ensure shakes every so often.  Patient appreciates she still misses lunch many times, and does not drink Ensure shakes all days.  Patient up 3 pounds since last visit, encouraged patient on her progress.  Patient continues to eat good breakfast and dinner, but is missing lunch many days.  Will encourage patient to eat 3 meals a day, and drink 1-2 Ensure shakes a day. - 3 good meals a day, (must eat lunch) - 1-2 Ensure shakes with snacks - Follow-up 2 months

## 2023-07-10 NOTE — Therapy (Addendum)
OUTPATIENT PHYSICAL THERAPY THORACOLUMBAR TREATMENT/Re-Cert/DC   Patient Name: Jeanne Huynh MRN: [id number] DOB:11-14-1965, 57 y.o., female Today's Date: 07/10/2023  END OF SESSION:  PT End of Session - 07/10/23 1553     Visit Number 6    Number of Visits 18    Date for PT Re-Evaluation 08/31/23    Authorization Type [place] [employer]    PT Start Time 1549    PT Stop Time 1630    PT Time Calculation (min) 41 min    Activity Tolerance Patient tolerated treatment well    Behavior During Therapy WFL for tasks assessed/performed                 Past Medical History:  Diagnosis Date   Asthma    Enlarged kidney    Kidney stone    No past surgical history on file. Patient Active Problem List   Diagnosis Date Noted   Ganglion cyst 07/10/2023   Renal cyst, left 03/07/2023   Routine screening for STI (sexually transmitted infection) 01/09/2023   Tobacco abuse 01/09/2023   Health care maintenance 01/09/2023   Unintentional weight loss 12/20/2022   Anxiety 12/20/2022   Lumbar back pain 03/07/2021   Does not have health insurance 01/16/2021   Abnormal urine finding 01/16/2021   Leg pain, bilateral 12/23/2020   Pyelonephritis 06/25/2013    PCP: Dr. Bess Kinds REFERRING PROVIDER: Dr. Bess Kinds  REFERRING DIAG: M54.50 (ICD-10-CM) - Lumbar back pain  Rationale for Evaluation and Treatment: Rehabilitation  THERAPY DIAG:  Muscle weakness (generalized)  Other low back pain  ONSET DATE: chronic   SUBJECTIVE:                                                                                                                                                                                           SUBJECTIVE STATEMENT: "My back pain is pretty good today. The therapy and exs at home are helping."   EVAL: I have bad back pain. My legs are so weak and they don't know why.  Legs lock up , spasms in legs and feet.  Pt has known lumbar arthritis.  She  went to the hospital about 2 yrs ago for LE spasms and weakness. Her pain has worsened.  She has pain in tailbone which travels down both legs.  Described as burning and stinging. She has difficulty stooping and coming back up. Reaching overhead at work is hard.  Sleep disturbances with pain waking her up at night.  Pain with reaching back, twisting. Sitting increased tailbone pain, if not supported, has to pace a lot. Standing (still) affects LE pain.  Walking usually improves her pain to a degree.   PERTINENT HISTORY:  Seeing Dietitian for underweight Anxiety  PAIN:  Are you having pain? Yes: NPRS scale: 4/10 Pain location:back  Pain description: stinging, burning  Aggravating factors: Sitting increases tailbone pain standing increasing back and leg pain Relieving factors: Lying down, heating pad  PRECAUTIONS: None  RED FLAGS: None   WEIGHT BEARING RESTRICTIONS: No  FALLS:  Has patient fallen in last 6 months? Yes. Number of falls 1, fell in bathroom   LIVING ENVIRONMENT: Lives with: lives with their spouse Lives in: House/apartment Stairs: No Has following equipment at home: None  OCCUPATION: works part time , [employer]  PLOF: Independent with basic ADLs, Independent with household mobility without device, Independent with community mobility without device, Vocation/Vocational requirements: standing, does not lift at work, and Leisure: family   PATIENT GOALS: I want to have less pain , strengthen my arms and legs   NEXT MD VISIT: As needed  OBJECTIVE:   DIAGNOSTIC FINDINGS:  FINDINGS: Five lumbar type vertebral bodies. There is no evidence of lumbar spine fracture. Straightening of the normal lumbar lordosis. L2-L3 and L3-L4 disc space narrowing with osteophytosis. Lumbar facet hypertrophy. Aortic atherosclerosis.   IMPRESSION: Slight interval increase in lumbar spondylosis most notably at L2-L3 and L3-L4. No acute osseous abnormality.  PATIENT SURVEYS:   FOTO 39%  SCREENING FOR RED FLAGS: Bowel or bladder incontinence: No Spinal tumors: No Cauda equina syndrome: No Compression fracture: No Abdominal aneurysm: No  COGNITION: Overall cognitive status: Within functional limits for tasks assessed     SENSATION: WFL For MUSCLE LENGTH: Hamstrings: Right 45 deg; Left 45 deg  POSTURE: rounded shoulders, forward head, and decreased lumbar lordosis  PALPATION: Pain centrally L 5 and SI border soreness in bilateral glutes  LUMBAR ROM:   AROM eval  Flexion WNL pain returning   Extension Painful, 25%   Right lateral flexion WNL   Left lateral flexion WNL  Right rotation WNL  Left rotation WNL    (Blank rows = not tested)  LOWER EXTREMITY ROM:   WNL   Active  Right eval Left eval  Hip flexion    Hip extension    Hip abduction    Hip adduction    Hip internal rotation    Hip external rotation    Knee flexion    Knee extension    Ankle dorsiflexion    Ankle plantarflexion    Ankle inversion    Ankle eversion     (Blank rows = not tested)  LOWER EXTREMITY MMT:    MMT Right eval Left eval  Hip flexion 4 4  Hip extension 3+ 3+  Hip abduction 3+ 3+  Hip adduction    Hip internal rotation    Hip external rotation    Knee flexion 4 4-  Knee extension 4+ 4+  Ankle dorsiflexion    Ankle plantarflexion    Ankle inversion    Ankle eversion     (Blank rows = not tested)  LUMBAR SPECIAL TESTS:  Prone instability test: Negative, Straight leg raise test: Negative, Slump test: Negative, Single leg stance test: Negative, and Trendelenburg sign: Negative  FUNCTIONAL TESTS:  5 times sit to stand: 10.8 sec 30 seconds chair stand test  13 reps  05/30/23: 2 MWT 445 feet -at 1: 30 sec pt reports increased weakness in anterior thighs and Right knee began to hyper extend/ decreased control   GAIT: Distance walked: 150 Assistive device utilized: None Level of assistance:  SBA Comments: Post session right knee hyperextends,  weakness  TODAY'S TREATMENT:      [facility] Adult PT Treatment:                                                DATE: 07/10/23 Therapeutic Exercise: Step up 6 inch no UEs  x 10  Wall squat 1/3 with 5 sec hold  x 10  Calf stretch 30 sec x 3 on wall each leg  Hinged hip STS x10 Ball squeeze with bridge x 10  Bridge with band x10 RTB Clam band red x 15 March red band x 10  Q-ped cat and camel Q-Ped Hip extension 2x5, verbal cueing LAQ x ball sqeeze x15 Therapeutic Activity: FOTO was reassessed and reviewed with pt  [facility] Adult PT Treatment:                                                DATE: 06/25/23 Therapeutic Exercise: NuStep L6 UE and LE for 5 min  Step ups 6 inch with UE assist (added opp leg hip flex) Step up 6 inch no UEs  x 10  Wall squat 1/3 with 5 sec hold  x 10  Calf stretch 30 sec x 3 on wall each leg  Banded knee extension red x 12  Ball squeeze with bridge x 10  Bridge with band  Ball squeeze with knee extension x 10  Bridge with clam band red x 15 March red band x 10  Q-ped cat and camel Hip extension -unable - mod to prone with pillow  Bent knee prone extension x 10 each   [facility] Adult PT Treatment:                                                DATE: 06/20/23 Therapeutic Exercise: NuStep L 5 UE and LE for 5 min  Calf stretch on wall each side x 3, 30 sec  Heel raise off step x 15 Calf stretch off step LLE x 3  Hamstring stretch L x 2 each 30 sec  Banded bridge green band  x 10  Manual Therapy: IASTM to L calf and arch of foot in prone Soft tissue to gastroc, lateral more tender Moderate pressure applied to plantar fascia with knee flexed and ankle dorsiflexed gentle friction  [facility] Adult PT Treatment:                                                DATE: 06/11/23 Therapeutic Exercise: Supine Lower Trunk Rotation  x 10 Supine Bridge x 10 x 2 , green band for 2nd set  SLR x 15 x 2  Sidelying Hip Abduction x 10  Sit to Stand 2 x 10, no UEs , 1 set with 10 lbs   Heel raise x 15  Hip extension x 10  Hip abduction x 10  Wall sit 10 sec x 5  Wall push up x 10  Supine green  band ABD/ER x 10, bilateral then unilateral  Knee to chest Hamstring stretch   Self Care: Heel lift in L insole and explanation, measurement of leg length discrepancy          PATIENT EDUCATION:  Education details: HEP, self care, nerve pain , causes of weakness  Person educated: Patient Education method: Explanation, Demonstration, Verbal cues, and Handouts Education comprehension: verbalized understanding, returned demonstration, and needs further education  HOME EXERCISE PROGRAM: Access Code: [email or web address] URL: [email or web address] Date: 06/11/2023 Prepared by: Karie Mainland  Exercises - Supine Lower Trunk Rotation  - 1 x daily - 7 x weekly - 2 sets - 10 reps - 10 hold - Sidelying Hip Abduction  - 1 x daily - 7 x weekly - 2 sets - 10 reps - 5 hold - Seated Hamstring Stretch  - 1 x daily - 7 x weekly - 1 sets - 3-5 reps - 30 hold - Sit to Stand with Counter Support  - 1 x daily - 7 x weekly - 2 sets - 10 reps - 5 hold - Wall Push Up  - 1 x daily - 7 x weekly - 2 sets - 10 reps - 5 hold - Active straight leg raise  - 1 x daily - 7 x weekly - 2 sets - 5 reps - Supine Bridge with Resistance Band  - 1 x daily - 7 x weekly - 2 sets - 10 reps - 5 hold - Wall Quarter Squat  - 1 x daily - 7 x weekly - 1 sets - 5-10 reps - 10 hold   ASSESSMENT:  CLINICAL IMPRESSION: PT was continued for LE strengthening today. Pt's FOTO score, for perceived functional ability, was reassessed and found improved reflecting pt's report of improvement and meeting this goal.  Pt was able to perform hinged hip STS with proper technique. Overall, pt is making appropriate gains c PT re: function and pain. Pt tolerated PT today without adverse effects. Pt will benefit from skilled PT 2w6 to address impairments to optimize function with less pain.    OBJECTIVE IMPAIRMENTS: decreased  activity tolerance, decreased balance, decreased endurance, decreased mobility, difficulty walking, decreased ROM, decreased strength, increased fascial restrictions, increased muscle spasms, improper body mechanics, postural dysfunction, and pain.   ACTIVITY LIMITATIONS: carrying, lifting, bending, sitting, standing, squatting, sleeping, bed mobility, and locomotion level, reaching overhead   PARTICIPATION LIMITATIONS: cleaning, community activity, and occupation  PERSONAL FACTORS: Time since onset of injury/illness/exacerbation and 1 comorbidity: Anxiety  are also affecting patient's functional outcome.   REHAB POTENTIAL: Excellent  CLINICAL DECISION MAKING: Stable/uncomplicated  EVALUATION COMPLEXITY: Low   GOALS: Goals reviewed with patient? Yes  SHORT TERM GOALS: Target date: 06/04/2023    Pt will be I with initial HEP  Baseline: Given on Goal status: met   2.  Patient will complete functional testing and set goal Baseline: 2 min walk tet see above, 445 feet  Goal status: met   3.  Patient will notice improved strength in legs with transfers and post PT Baseline: Legs felt very weak after her first session right knee with hyperextending Goal status: ongoing    LONG TERM GOALS: Target date: 07/02/2023    Foto score will improve to 47% Baseline: 39% 07/10/23: 47% Goal status: MET  2.  Patient will be independent with home exercise program for long-term health Baseline: unknown  Goal status:ongoing   3.  Patient will be able to sit 30 minutes comfortably for meals with family Baseline: increased sacral pain,  severe  07/10/23: pt est 20-30 mins Goal status Improved-:ongoing   4.  Patient will be able to demonstrate proper squat mechanics for lifting light items at home  Baseline: needs cues  Goal status: ongoing   5.  Patient will be able to improve lower extremity strength 5 out of 5 Baseline: see above  Goal status: ongoing   6.  Patient will be able to  reach overhead without increased back/leg pain for improved home tasks and work Baseline: Increased pain in back  goal status: ongoing   PLAN:  PT FREQUENCY: 2x/week  PT DURATION: 6 weeks  PLANNED INTERVENTIONS: Therapeutic exercises, Therapeutic activity, Neuromuscular re-education, Balance training, Gait training, Patient/Family education, Self Care, Joint mobilization, Dry Needling, Spinal mobilization, Cryotherapy, Moist heat, Taping, Manual therapy, and Re-evaluation.  PLAN FOR NEXT SESSION:   Joellyn Rued MS, PT 07/10/23 10:35 PM  PHYSICAL THERAPY DISCHARGE SUMMARY  Visits from Start of Care: 6  Current functional level related to goals / functional outcomes: See clinical impression and PT goals    Remaining deficits: See clinical impression and PT goals    Education / Equipment: HEP   Patient agrees to discharge. Patient goals were partially met. Patient is being discharged due to  3 no show appts, .

## 2023-07-10 NOTE — Progress Notes (Signed)
  SUBJECTIVE:   CHIEF COMPLAINT / HPI:   Weight loss -Seen 10/2 and 8/22 for weight loss, plan for increased meals, booste shakes, daily exercise, healthy snacks -Wt drop from 102 to 98 lb Gaining weight, doing well. Has done some protein shakes, for snacks and misses lunch sometimes. Is eating good breakfast and dinner.   Leg Pain / Back Pain  -Lumbar spondylosis L2-L4 06/22 on DG lumbar  Today: Leg pain doing better since stopping gabapentin (no longer tingling and burning). Also going to PT and found to have one foot 2 inches shorter (left) than the right. Is now wearing shoes for this and appreciates improvement in her back pain.   Knot of Right foot Was noted to have a knot on plantar surface of foot, mid foot. Newman Pies is slightly ttp but tends to not bother patient. Newman Pies was noticed by PT. Patient unsure how long it's been there.     PERTINENT  PMH / PSH:    OBJECTIVE:  LMP 05/04/2013  Physical Exam Musculoskeletal:       Feet:  Feet:     Comments: Small 1 cm nodule located in center of foot, not fluctuant     ASSESSMENT/PLAN:   Assessment & Plan Unintentional weight loss Patient comes in for follow-up of her weight loss.  Patient has been attempting to gain weight.  Patient appreciates she has been snacking more frequently, drinking Ensure shakes every so often.  Patient appreciates she still misses lunch many times, and does not drink Ensure shakes all days.  Patient up 3 pounds since last visit, encouraged patient on her progress.  Patient continues to eat good breakfast and dinner, but is missing lunch many days.  Will encourage patient to eat 3 meals a day, and drink 1-2 Ensure shakes a day. - 3 good meals a day, (must eat lunch) - 1-2 Ensure shakes with snacks - Follow-up 2 months  Lumbar back pain Patient appreciates her back pain is much improved.  Patient was found to have 1 leg shorter than the other, and has started wearing shoes to compensate for this.   Patient appreciates her back pain has largely resolved. - Continue to monitor Ganglion cyst Patient noted to have centimeter nodule on plantar surface of foot, center of foot.  Nodule is slightly tender to palpation, tends to not bother patient.  Patient unsure of how long it has been there.  Newman Pies was found by physical therapist.  Nodule most concerning for ganglion cyst, given location and morphology.  Will recommend patient follow-up with sports medicine for management/imaging. - Follow-up sports medicine, resources given Leg pain, bilateral Patient appreciates her bilateral leg pain has greatly improved since stopping gabapentin.  Patient appreciates that she might have allergy to gabapentin, but following 2 weeks without gabapentin, her leg tingling and burning has subsided.  Patient feeling much better, and in good spirits today.  Will add gabapentin intolerance to allergies list. - Continue to monitor - Avoid gabapentin No follow-ups on file. Bess Kinds, MD 07/10/2023, 12:41 PM PGY-3, [facility]

## 2023-07-10 NOTE — Assessment & Plan Note (Addendum)
Patient noted to have centimeter nodule on plantar surface of foot, center of foot.  Nodule is slightly tender to palpation, tends to not bother patient.  Patient unsure of how long it has been there.  Newman Pies was found by physical therapist.  Nodule most concerning for ganglion cyst, given location and morphology.  Will recommend patient follow-up with sports medicine for management/imaging. - Follow-up sports medicine, resources given

## 2023-07-10 NOTE — Patient Instructions (Signed)
It was great to see you! Thank you for allowing me to participate in your care!  I recommend that you always bring your medications to each appointment as this makes it easy to ensure we are on the correct medications and helps [place] not miss when refills are needed.  Our plans for today:  - Weight Loss  Try to drink 1-2 shakes a day for snacks. Be sure to get lunch every day as well.  Make follow up in January  Keep Up the Good Work!!  - Foot Cyst This looks like a ganglion cyst. These are benign, but can be bothersome. They can go away on their own, or sometimes need to be treated. I recommend you follow up with Sport's Medicine. They will be able to image and make recommendations about it.   [facility] Address: [address] [place], [place], [place] [address] Phone: [phone]       Take care and seek immediate care sooner if you develop any concerns.   Dr. Bess Kinds, MD [facility]

## 2023-07-10 NOTE — Assessment & Plan Note (Addendum)
Patient appreciates her back pain is much improved.  Patient was found to have 1 leg shorter than the other, and has started wearing shoes to compensate for this.  Patient appreciates her back pain has largely resolved. - Continue to monitor

## 2023-07-10 NOTE — Assessment & Plan Note (Addendum)
Patient appreciates her bilateral leg pain has greatly improved since stopping gabapentin.  Patient appreciates that she might have allergy to gabapentin, but following 2 weeks without gabapentin, her leg tingling and burning has subsided.  Patient feeling much better, and in good spirits today.  Will add gabapentin intolerance to allergies list. - Continue to monitor - Avoid gabapentin

## 2023-07-26 ENCOUNTER — Telehealth: Payer: Self-pay

## 2023-07-26 ENCOUNTER — Ambulatory Visit: Payer: Medicaid Other | Attending: Family Medicine

## 2023-07-26 NOTE — Therapy (Incomplete)
OUTPATIENT PHYSICAL THERAPY THORACOLUMBAR TREATMENT/Re-Cert    Patient Name: Jeanne Huynh MRN: [id number] DOB:1966-06-01, 57 y.o., female Today's Date: 07/26/2023  END OF SESSION:        Past Medical History:  Diagnosis Date   Asthma    Enlarged kidney    Kidney stone    No past surgical history on file. Patient Active Problem List   Diagnosis Date Noted   Ganglion cyst 07/10/2023   Renal cyst, left 03/07/2023   Routine screening for STI (sexually transmitted infection) 01/09/2023   Tobacco abuse 01/09/2023   Health care maintenance 01/09/2023   Unintentional weight loss 12/20/2022   Anxiety 12/20/2022   Lumbar back pain 03/07/2021   Does not have health insurance 01/16/2021   Abnormal urine finding 01/16/2021   Leg pain, bilateral 12/23/2020   Pyelonephritis 06/25/2013    PCP: Dr. Bess Kinds REFERRING PROVIDER: Dr. Bess Kinds  REFERRING DIAG: M54.50 (ICD-10-CM) - Lumbar back pain  Rationale for Evaluation and Treatment: Rehabilitation  THERAPY DIAG:  No diagnosis found.  ONSET DATE: chronic   SUBJECTIVE:                                                                                                                                                                                           SUBJECTIVE STATEMENT: "My back pain is pretty good today. The therapy and exs at home are helping."   EVAL: I have bad back pain. My legs are so weak and they don't know why.  Legs lock up , spasms in legs and feet.  Pt has known lumbar arthritis.  She went to the hospital about 2 yrs ago for LE spasms and weakness. Her pain has worsened.  She has pain in tailbone which travels down both legs.  Described as burning and stinging. She has difficulty stooping and coming back up. Reaching overhead at work is hard.  Sleep disturbances with pain waking her up at night.  Pain with reaching back, twisting. Sitting increased tailbone pain, if not supported, has to pace a  lot. Standing (still) affects LE pain.  Walking usually improves her pain to a degree.   PERTINENT HISTORY:  Seeing Dietitian for underweight Anxiety  PAIN:  Are you having pain? Yes: NPRS scale: 4/10 Pain location:back  Pain description: stinging, burning  Aggravating factors: Sitting increases tailbone pain standing increasing back and leg pain Relieving factors: Lying down, heating pad  PRECAUTIONS: None  RED FLAGS: None   WEIGHT BEARING RESTRICTIONS: No  FALLS:  Has patient fallen in last 6 months? Yes. Number of falls 1, fell in bathroom   LIVING ENVIRONMENT: Lives with: lives with  their spouse Lives in: House/apartment Stairs: No Has following equipment at home: None  OCCUPATION: works part time , [employer]  PLOF: Independent with basic ADLs, Independent with household mobility without device, Independent with community mobility without device, Vocation/Vocational requirements: standing, does not lift at work, and Leisure: family   PATIENT GOALS: I want to have less pain , strengthen my arms and legs   NEXT MD VISIT: As needed  OBJECTIVE:   DIAGNOSTIC FINDINGS:  FINDINGS: Five lumbar type vertebral bodies. There is no evidence of lumbar spine fracture. Straightening of the normal lumbar lordosis. L2-L3 and L3-L4 disc space narrowing with osteophytosis. Lumbar facet hypertrophy. Aortic atherosclerosis.   IMPRESSION: Slight interval increase in lumbar spondylosis most notably at L2-L3 and L3-L4. No acute osseous abnormality.  PATIENT SURVEYS:  FOTO 39%  SCREENING FOR RED FLAGS: Bowel or bladder incontinence: No Spinal tumors: No Cauda equina syndrome: No Compression fracture: No Abdominal aneurysm: No  COGNITION: Overall cognitive status: Within functional limits for tasks assessed     SENSATION: WFL For MUSCLE LENGTH: Hamstrings: Right 45 deg; Left 45 deg  POSTURE: rounded shoulders, forward head, and decreased lumbar  lordosis  PALPATION: Pain centrally L 5 and SI border soreness in bilateral glutes  LUMBAR ROM:   AROM eval  Flexion WNL pain returning   Extension Painful, 25%   Right lateral flexion WNL   Left lateral flexion WNL  Right rotation WNL  Left rotation WNL    (Blank rows = not tested)  LOWER EXTREMITY ROM:   WNL   Active  Right eval Left eval  Hip flexion    Hip extension    Hip abduction    Hip adduction    Hip internal rotation    Hip external rotation    Knee flexion    Knee extension    Ankle dorsiflexion    Ankle plantarflexion    Ankle inversion    Ankle eversion     (Blank rows = not tested)  LOWER EXTREMITY MMT:    MMT Right eval Left eval  Hip flexion 4 4  Hip extension 3+ 3+  Hip abduction 3+ 3+  Hip adduction    Hip internal rotation    Hip external rotation    Knee flexion 4 4-  Knee extension 4+ 4+  Ankle dorsiflexion    Ankle plantarflexion    Ankle inversion    Ankle eversion     (Blank rows = not tested)  LUMBAR SPECIAL TESTS:  Prone instability test: Negative, Straight leg raise test: Negative, Slump test: Negative, Single leg stance test: Negative, and Trendelenburg sign: Negative  FUNCTIONAL TESTS:  5 times sit to stand: 10.8 sec 30 seconds chair stand test  13 reps  05/30/23: 2 MWT 445 feet -at 1: 30 sec pt reports increased weakness in anterior thighs and Right knee began to hyper extend/ decreased control   GAIT: Distance walked: 150 Assistive device utilized: None Level of assistance: SBA Comments: Post session right knee hyperextends, weakness  TODAY'S TREATMENT:   [facility] Adult PT Treatment:                                                DATE: 07/26/23 Therapeutic Exercise: Step up 6 inch no UEs  x 10  Wall squat 1/3 with 5 sec hold  x 10  Calf stretch 30 sec x  3 on wall each leg  Hinged hip STS x10 Ball squeeze with bridge x 10  Bridge with band x10 RTB Clam band red x 15 March red band x 10  Q-ped cat and  camel Q-Ped Hip extension 2x5, verbal cueing LAQ x ball sqeeze x15 Therapeutic Exercise: *** Manual Therapy: *** Neuromuscular re-ed: *** Therapeutic Activity: *** Modalities: *** Self Care: ***     [facility]:                                                DATE: 07/10/23 Therapeutic Exercise: Step up 6 inch no UEs  x 10  Wall squat 1/3 with 5 sec hold  x 10  Calf stretch 30 sec x 3 on wall each leg  Hinged hip STS x10 Ball squeeze with bridge x 10  Bridge with band x10 RTB Clam band red x 15 March red band x 10  Q-ped cat and camel Q-Ped Hip extension 2x5, verbal cueing LAQ x ball sqeeze x15 Therapeutic Activity: FOTO was reassessed and reviewed with pt  [facility] Adult PT Treatment:                                                DATE: 06/25/23 Therapeutic Exercise: NuStep L6 UE and LE for 5 min  Step ups 6 inch with UE assist (added opp leg hip flex) Step up 6 inch no UEs  x 10  Wall squat 1/3 with 5 sec hold  x 10  Calf stretch 30 sec x 3 on wall each leg  Banded knee extension red x 12  Ball squeeze with bridge x 10  Bridge with band  Ball squeeze with knee extension x 10  Bridge with clam band red x 15 March red band x 10  Q-ped cat and camel Hip extension -unable - mod to prone with pillow  Bent knee prone extension x 10 each   [facility] Adult PT Treatment:                                                DATE: 06/20/23 Therapeutic Exercise: NuStep L 5 UE and LE for 5 min  Calf stretch on wall each side x 3, 30 sec  Heel raise off step x 15 Calf stretch off step LLE x 3  Hamstring stretch L x 2 each 30 sec  Banded bridge green band  x 10  Manual Therapy: IASTM to L calf and arch of foot in prone Soft tissue to gastroc, lateral more tender Moderate pressure applied to plantar fascia with knee flexed and ankle dorsiflexed gentle friction  [facility] Adult PT Treatment:                                                DATE: 06/11/23 Therapeutic  Exercise: Supine Lower Trunk Rotation  x 10 Supine Bridge x 10 x 2 , green band for 2nd set  SLR x 15 x  2  Sidelying Hip Abduction x 10  Sit to Stand 2 x 10, no UEs , 1 set with 10 lbs  Heel raise x 15  Hip extension x 10  Hip abduction x 10  Wall sit 10 sec x 5  Wall push up x 10  Supine green band ABD/ER x 10, bilateral then unilateral  Knee to chest Hamstring stretch   Self Care: Heel lift in L insole and explanation, measurement of leg length discrepancy          PATIENT EDUCATION:  Education details: HEP, self care, nerve pain , causes of weakness  Person educated: Patient Education method: Explanation, Demonstration, Verbal cues, and Handouts Education comprehension: verbalized understanding, returned demonstration, and needs further education  HOME EXERCISE PROGRAM: Access Code: [email or web address] URL: [email or web address] Date: 06/11/2023 Prepared by: Karie Mainland  Exercises - Supine Lower Trunk Rotation  - 1 x daily - 7 x weekly - 2 sets - 10 reps - 10 hold - Sidelying Hip Abduction  - 1 x daily - 7 x weekly - 2 sets - 10 reps - 5 hold - Seated Hamstring Stretch  - 1 x daily - 7 x weekly - 1 sets - 3-5 reps - 30 hold - Sit to Stand with Counter Support  - 1 x daily - 7 x weekly - 2 sets - 10 reps - 5 hold - Wall Push Up  - 1 x daily - 7 x weekly - 2 sets - 10 reps - 5 hold - Active straight leg raise  - 1 x daily - 7 x weekly - 2 sets - 5 reps - Supine Bridge with Resistance Band  - 1 x daily - 7 x weekly - 2 sets - 10 reps - 5 hold - Wall Quarter Squat  - 1 x daily - 7 x weekly - 1 sets - 5-10 reps - 10 hold   ASSESSMENT:  CLINICAL IMPRESSION: PT was continued for LE strengthening today. Pt's FOTO score, for perceived functional ability, was reassessed and found improved reflecting pt's report of improvement and meeting this goal.  Pt was able to perform hinged hip STS with proper technique. Overall, pt is making appropriate gains c PT re: function and  pain. Pt tolerated PT today without adverse effects. Pt will benefit from skilled PT 2w6 to address impairments to optimize function with less pain.    OBJECTIVE IMPAIRMENTS: decreased activity tolerance, decreased balance, decreased endurance, decreased mobility, difficulty walking, decreased ROM, decreased strength, increased fascial restrictions, increased muscle spasms, improper body mechanics, postural dysfunction, and pain.   ACTIVITY LIMITATIONS: carrying, lifting, bending, sitting, standing, squatting, sleeping, bed mobility, and locomotion level, reaching overhead   PARTICIPATION LIMITATIONS: cleaning, community activity, and occupation  PERSONAL FACTORS: Time since onset of injury/illness/exacerbation and 1 comorbidity: Anxiety  are also affecting patient's functional outcome.   REHAB POTENTIAL: Excellent  CLINICAL DECISION MAKING: Stable/uncomplicated  EVALUATION COMPLEXITY: Low   GOALS: Goals reviewed with patient? Yes  SHORT TERM GOALS: Target date: 06/04/2023    Pt will be I with initial HEP  Baseline: Given on Goal status: met   2.  Patient will complete functional testing and set goal Baseline: 2 min walk tet see above, 445 feet  Goal status: met   3.  Patient will notice improved strength in legs with transfers and post PT Baseline: Legs felt very weak after her first session right knee with hyperextending Goal status: ongoing    LONG TERM GOALS: Target date: 07/02/2023  Foto score will improve to 47% Baseline: 39% 07/10/23: 47% Goal status: MET  2.  Patient will be independent with home exercise program for long-term health Baseline: unknown  Goal status:ongoing   3.  Patient will be able to sit 30 minutes comfortably for meals with family Baseline: increased sacral pain, severe  07/10/23: pt est 20-30 mins Goal status Improved-:ongoing   4.  Patient will be able to demonstrate proper squat mechanics for lifting light items at home  Baseline:  needs cues  Goal status: ongoing   5.  Patient will be able to improve lower extremity strength 5 out of 5 Baseline: see above  Goal status: ongoing   6.  Patient will be able to reach overhead without increased back/leg pain for improved home tasks and work Baseline: Increased pain in back  goal status: ongoing   PLAN:  PT FREQUENCY: 2x/week  PT DURATION: 6 weeks  PLANNED INTERVENTIONS: Therapeutic exercises, Therapeutic activity, Neuromuscular re-education, Balance training, Gait training, Patient/Family education, Self Care, Joint mobilization, Dry Needling, Spinal mobilization, Cryotherapy, Moist heat, Taping, Manual therapy, and Re-evaluation.  PLAN FOR NEXT SESSION:   Joellyn Rued MS, PT 07/26/23 6:10 AM

## 2023-07-26 NOTE — Telephone Encounter (Signed)
Spoke to pt via phone re: a missed visit. Pt states she did not receive a reminder text. Pt was reminded of her next visit.

## 2023-08-05 NOTE — Therapy (Deleted)
OUTPATIENT PHYSICAL THERAPY THORACOLUMBAR TREATMENT/Re-Cert    Patient Name: Jeanne Huynh MRN: [id number] DOB:[birth year], 57 y.o., female Today's Date: 08/05/2023  END OF SESSION:        Past Medical History:  Diagnosis Date   Asthma    Enlarged kidney    Kidney stone    No past surgical history on file. Patient Active Problem List   Diagnosis Date Noted   Ganglion cyst 07/10/2023   Renal cyst, left 03/07/2023   Routine screening for STI (sexually transmitted infection) 01/09/2023   Tobacco abuse 01/09/2023   Health care maintenance 01/09/2023   Unintentional weight loss 12/20/2022   Anxiety 12/20/2022   Lumbar back pain 03/07/2021   Does not have health insurance 01/16/2021   Abnormal urine finding 01/16/2021   Leg pain, bilateral 12/23/2020   Pyelonephritis 06/25/2013    PCP: Dr. Bess Kinds REFERRING PROVIDER: Dr. Bess Kinds  REFERRING DIAG: M54.50 (ICD-10-CM) - Lumbar back pain  Rationale for Evaluation and Treatment: Rehabilitation  THERAPY DIAG:  No diagnosis found.  ONSET DATE: chronic   SUBJECTIVE:                                                                                                                                                                                           SUBJECTIVE STATEMENT: "My back pain is pretty good today. The therapy and exs at home are helping."   EVAL: I have bad back pain. My legs are so weak and they don't know why.  Legs lock up , spasms in legs and feet.  Pt has known lumbar arthritis.  She went to the hospital about 2 yrs ago for LE spasms and weakness. Her pain has worsened.  She has pain in tailbone which travels down both legs.  Described as burning and stinging. She has difficulty stooping and coming back up. Reaching overhead at work is hard.  Sleep disturbances with pain waking her up at night.  Pain with reaching back, twisting. Sitting increased tailbone pain, if not supported, has to pace a  lot. Standing (still) affects LE pain.  Walking usually improves her pain to a degree.   PERTINENT HISTORY:  Seeing Dietitian for underweight Anxiety  PAIN:  Are you having pain? Yes: NPRS scale: 4/10 Pain location:back  Pain description: stinging, burning  Aggravating factors: Sitting increases tailbone pain standing increasing back and leg pain Relieving factors: Lying down, heating pad  PRECAUTIONS: None  RED FLAGS: None   WEIGHT BEARING RESTRICTIONS: No  FALLS:  Has patient fallen in last 6 months? Yes. Number of falls 1, fell in bathroom   LIVING ENVIRONMENT: Lives with: lives with  their spouse Lives in: House/apartment Stairs: No Has following equipment at home: None  OCCUPATION: works part time , [employer]  PLOF: Independent with basic ADLs, Independent with household mobility without device, Independent with community mobility without device, Vocation/Vocational requirements: standing, does not lift at work, and Leisure: family   PATIENT GOALS: I want to have less pain , strengthen my arms and legs   NEXT MD VISIT: As needed  OBJECTIVE:   DIAGNOSTIC FINDINGS:  FINDINGS: Five lumbar type vertebral bodies. There is no evidence of lumbar spine fracture. Straightening of the normal lumbar lordosis. L2-L3 and L3-L4 disc space narrowing with osteophytosis. Lumbar facet hypertrophy. Aortic atherosclerosis.   IMPRESSION: Slight interval increase in lumbar spondylosis most notably at L2-L3 and L3-L4. No acute osseous abnormality.  PATIENT SURVEYS:  FOTO 39%  SCREENING FOR RED FLAGS: Bowel or bladder incontinence: No Spinal tumors: No Cauda equina syndrome: No Compression fracture: No Abdominal aneurysm: No  COGNITION: Overall cognitive status: Within functional limits for tasks assessed     SENSATION: WFL For MUSCLE LENGTH: Hamstrings: Right 45 deg; Left 45 deg  POSTURE: rounded shoulders, forward head, and decreased lumbar  lordosis  PALPATION: Pain centrally L 5 and SI border soreness in bilateral glutes  LUMBAR ROM:   AROM eval  Flexion WNL pain returning   Extension Painful, 25%   Right lateral flexion WNL   Left lateral flexion WNL  Right rotation WNL  Left rotation WNL    (Blank rows = not tested)  LOWER EXTREMITY ROM:   WNL   Active  Right eval Left eval  Hip flexion    Hip extension    Hip abduction    Hip adduction    Hip internal rotation    Hip external rotation    Knee flexion    Knee extension    Ankle dorsiflexion    Ankle plantarflexion    Ankle inversion    Ankle eversion     (Blank rows = not tested)  LOWER EXTREMITY MMT:    MMT Right eval Left eval  Hip flexion 4 4  Hip extension 3+ 3+  Hip abduction 3+ 3+  Hip adduction    Hip internal rotation    Hip external rotation    Knee flexion 4 4-  Knee extension 4+ 4+  Ankle dorsiflexion    Ankle plantarflexion    Ankle inversion    Ankle eversion     (Blank rows = not tested)  LUMBAR SPECIAL TESTS:  Prone instability test: Negative, Straight leg raise test: Negative, Slump test: Negative, Single leg stance test: Negative, and Trendelenburg sign: Negative  FUNCTIONAL TESTS:  5 times sit to stand: 10.8 sec 30 seconds chair stand test  13 reps  05/30/23: 2 MWT 445 feet -at 1: 30 sec pt reports increased weakness in anterior thighs and Right knee began to hyper extend/ decreased control   GAIT: Distance walked: 150 Assistive device utilized: None Level of assistance: SBA Comments: Post session right knee hyperextends, weakness  TODAY'S TREATMENT:   [facility] Adult PT Treatment:                                                DATE: 07/26/23 Therapeutic Exercise: Step up 6 inch no UEs  x 10  Wall squat 1/3 with 5 sec hold  x 10  Calf stretch 30 sec x  3 on wall each leg  Hinged hip STS x10 Ball squeeze with bridge x 10  Bridge with band x10 RTB Clam band red x 15 March red band x 10  Q-ped cat and  camel Q-Ped Hip extension 2x5, verbal cueing LAQ x ball sqeeze x15 Therapeutic Exercise: *** Manual Therapy: *** Neuromuscular re-ed: *** Therapeutic Activity: *** Modalities: *** Self Care: ***     [facility]:                                                DATE: 07/10/23 Therapeutic Exercise: Step up 6 inch no UEs  x 10  Wall squat 1/3 with 5 sec hold  x 10  Calf stretch 30 sec x 3 on wall each leg  Hinged hip STS x10 Ball squeeze with bridge x 10  Bridge with band x10 RTB Clam band red x 15 March red band x 10  Q-ped cat and camel Q-Ped Hip extension 2x5, verbal cueing LAQ x ball sqeeze x15 Therapeutic Activity: FOTO was reassessed and reviewed with pt  [facility] Adult PT Treatment:                                                DATE: 06/25/23 Therapeutic Exercise: NuStep L6 UE and LE for 5 min  Step ups 6 inch with UE assist (added opp leg hip flex) Step up 6 inch no UEs  x 10  Wall squat 1/3 with 5 sec hold  x 10  Calf stretch 30 sec x 3 on wall each leg  Banded knee extension red x 12  Ball squeeze with bridge x 10  Bridge with band  Ball squeeze with knee extension x 10  Bridge with clam band red x 15 March red band x 10  Q-ped cat and camel Hip extension -unable - mod to prone with pillow  Bent knee prone extension x 10 each   [facility] Adult PT Treatment:                                                DATE: 06/20/23 Therapeutic Exercise: NuStep L 5 UE and LE for 5 min  Calf stretch on wall each side x 3, 30 sec  Heel raise off step x 15 Calf stretch off step LLE x 3  Hamstring stretch L x 2 each 30 sec  Banded bridge green band  x 10  Manual Therapy: IASTM to L calf and arch of foot in prone Soft tissue to gastroc, lateral more tender Moderate pressure applied to plantar fascia with knee flexed and ankle dorsiflexed gentle friction  [facility] Adult PT Treatment:                                                DATE: 06/11/23 Therapeutic  Exercise: Supine Lower Trunk Rotation  x 10 Supine Bridge x 10 x 2 , green band for 2nd set  SLR x 15 x  2  Sidelying Hip Abduction x 10  Sit to Stand 2 x 10, no UEs , 1 set with 10 lbs  Heel raise x 15  Hip extension x 10  Hip abduction x 10  Wall sit 10 sec x 5  Wall push up x 10  Supine green band ABD/ER x 10, bilateral then unilateral  Knee to chest Hamstring stretch   Self Care: Heel lift in L insole and explanation, measurement of leg length discrepancy          PATIENT EDUCATION:  Education details: HEP, self care, nerve pain , causes of weakness  Person educated: Patient Education method: Explanation, Demonstration, Verbal cues, and Handouts Education comprehension: verbalized understanding, returned demonstration, and needs further education  HOME EXERCISE PROGRAM: Access Code: [email or web address] URL: [email or web address] Date: 06/11/2023 Prepared by: Karie Mainland  Exercises - Supine Lower Trunk Rotation  - 1 x daily - 7 x weekly - 2 sets - 10 reps - 10 hold - Sidelying Hip Abduction  - 1 x daily - 7 x weekly - 2 sets - 10 reps - 5 hold - Seated Hamstring Stretch  - 1 x daily - 7 x weekly - 1 sets - 3-5 reps - 30 hold - Sit to Stand with Counter Support  - 1 x daily - 7 x weekly - 2 sets - 10 reps - 5 hold - Wall Push Up  - 1 x daily - 7 x weekly - 2 sets - 10 reps - 5 hold - Active straight leg raise  - 1 x daily - 7 x weekly - 2 sets - 5 reps - Supine Bridge with Resistance Band  - 1 x daily - 7 x weekly - 2 sets - 10 reps - 5 hold - Wall Quarter Squat  - 1 x daily - 7 x weekly - 1 sets - 5-10 reps - 10 hold   ASSESSMENT:  CLINICAL IMPRESSION: PT was continued for LE strengthening today. Pt's FOTO score, for perceived functional ability, was reassessed and found improved reflecting pt's report of improvement and meeting this goal.  Pt was able to perform hinged hip STS with proper technique. Overall, pt is making appropriate gains c PT re: function and  pain. Pt tolerated PT today without adverse effects. Pt will benefit from skilled PT 2w6 to address impairments to optimize function with less pain.    OBJECTIVE IMPAIRMENTS: decreased activity tolerance, decreased balance, decreased endurance, decreased mobility, difficulty walking, decreased ROM, decreased strength, increased fascial restrictions, increased muscle spasms, improper body mechanics, postural dysfunction, and pain.   ACTIVITY LIMITATIONS: carrying, lifting, bending, sitting, standing, squatting, sleeping, bed mobility, and locomotion level, reaching overhead   PARTICIPATION LIMITATIONS: cleaning, community activity, and occupation  PERSONAL FACTORS: Time since onset of injury/illness/exacerbation and 1 comorbidity: Anxiety  are also affecting patient's functional outcome.   REHAB POTENTIAL: Excellent  CLINICAL DECISION MAKING: Stable/uncomplicated  EVALUATION COMPLEXITY: Low   GOALS: Goals reviewed with patient? Yes  SHORT TERM GOALS: Target date: 06/04/2023    Pt will be I with initial HEP  Baseline: Given on Goal status: met   2.  Patient will complete functional testing and set goal Baseline: 2 min walk tet see above, 445 feet  Goal status: met   3.  Patient will notice improved strength in legs with transfers and post PT Baseline: Legs felt very weak after her first session right knee with hyperextending Goal status: ongoing    LONG TERM GOALS: Target date: 07/02/2023  Foto score will improve to 47% Baseline: 39% 07/10/23: 47% Goal status: MET  2.  Patient will be independent with home exercise program for long-term health Baseline: unknown  Goal status:ongoing   3.  Patient will be able to sit 30 minutes comfortably for meals with family Baseline: increased sacral pain, severe  07/10/23: pt est 20-30 mins Goal status Improved-:ongoing   4.  Patient will be able to demonstrate proper squat mechanics for lifting light items at home  Baseline:  needs cues  Goal status: ongoing   5.  Patient will be able to improve lower extremity strength 5 out of 5 Baseline: see above  Goal status: ongoing   6.  Patient will be able to reach overhead without increased back/leg pain for improved home tasks and work Baseline: Increased pain in back  goal status: ongoing   PLAN:  PT FREQUENCY: 2x/week  PT DURATION: 6 weeks  PLANNED INTERVENTIONS: Therapeutic exercises, Therapeutic activity, Neuromuscular re-education, Balance training, Gait training, Patient/Family education, Self Care, Joint mobilization, Dry Needling, Spinal mobilization, Cryotherapy, Moist heat, Taping, Manual therapy, and Re-evaluation.  PLAN FOR NEXT SESSION:   Joellyn Rued MS, PT 08/05/23 7:09 PM

## 2023-08-06 ENCOUNTER — Telehealth: Payer: Self-pay | Admitting: Physical Therapy

## 2023-08-06 ENCOUNTER — Ambulatory Visit: Payer: Medicaid Other | Admitting: Physical Therapy

## 2023-08-06 NOTE — Telephone Encounter (Signed)
Patient failed to show for her appt today. Left message on voicemail.  I reminded her of her next appt. She can cancel her remaining appts if she no longer needs therapy. If she does not show for her next appts the remaining appts will be cancelled.   Karie Mainland, PT 08/06/23 3:21 PM Phone: [phone] Fax: [phone]

## 2023-08-09 ENCOUNTER — Ambulatory Visit: Payer: Medicaid Other

## 2023-08-10 ENCOUNTER — Telehealth: Payer: Self-pay

## 2023-08-10 NOTE — Telephone Encounter (Signed)
Spoke with pt about yesterday's no show visit. Pt was informed this was her 3rd no show visit and per the attendance policy she is being Dced from PT. Pt was advised she can return to PT with a new referral. Pt voiced from understanding.

## 2023-08-13 ENCOUNTER — Ambulatory Visit: Payer: Medicaid Other | Admitting: Physical Therapy

## 2023-08-22 ENCOUNTER — Ambulatory Visit: Payer: Medicaid Other | Admitting: Physical Therapy

## 2023-08-27 ENCOUNTER — Encounter: Payer: Medicaid Other | Admitting: Physical Therapy

## 2023-08-29 ENCOUNTER — Encounter: Payer: Medicaid Other | Admitting: Physical Therapy

## 2023-08-31 ENCOUNTER — Ambulatory Visit: Payer: Medicaid Other | Admitting: Student

## 2023-08-31 NOTE — Progress Notes (Deleted)
  SUBJECTIVE:   CHIEF COMPLAINT / HPI:   BIRTHDAY!!!!  Low weight Seen 11/26, noted to gain 3 lb -Rec 3 balanced meals a day, snacks w/ peanut butter/nuts/ensure -Exercise and sleep  Health Mait: Tdap/Zoster/PCV/Flu/Covid Colonoscopy  PERTINENT  PMH / PSH: ***  Past Medical History:  Diagnosis Date   Asthma    Enlarged kidney    Kidney stone    OBJECTIVE:  LMP 05/04/2013  Physical Exam   ASSESSMENT/PLAN:   Assessment & Plan  No follow-ups on file. Bess Kinds, MD 08/31/2023, 8:33 AM PGY-***, [facility] {    This will disappear when note is signed, click to select method of visit    :1}

## 2023-09-04 ENCOUNTER — Ambulatory Visit (AMBULATORY_SURGERY_CENTER): Payer: Medicaid Other

## 2023-09-04 VITALS — Ht 65.0 in | Wt 101.0 lb

## 2023-09-04 DIAGNOSIS — Z1211 Encounter for screening for malignant neoplasm of colon: Secondary | ICD-10-CM

## 2023-09-04 MED ORDER — SUFLAVE 178.7 G PO SOLR
1.0000 | ORAL | 0 refills | Status: DC
Start: 2023-09-04 — End: 2023-09-18

## 2023-09-04 NOTE — Progress Notes (Signed)
No egg or soy allergy known to patient  No issues known to pt with past sedation with any surgeries or procedures Patient denies ever being told they had issues or difficulty with intubation  No FH of Malignant Hyperthermia Pt is not on diet pills Pt is not on  home 02  Pt is not on blood thinners  Pt denies issues with constipation  No A fib or A flutter Have any cardiac testing pending--no  LOA: independent  Prep: suflave   Patient's chart reviewed by Cathlyn Parsons CNRA prior to previsit and patient appropriate for the [facility].  Previsit completed and red dot placed by patient's name on their procedure day (on provider's schedule).     PV competed with patient. Prep instructions sent via mychart and home address.

## 2023-09-18 ENCOUNTER — Ambulatory Visit (AMBULATORY_SURGERY_CENTER): Payer: Medicaid Other | Admitting: Internal Medicine

## 2023-09-18 ENCOUNTER — Encounter: Payer: Self-pay | Admitting: Internal Medicine

## 2023-09-18 ENCOUNTER — Encounter: Payer: Medicaid Other | Admitting: Gastroenterology

## 2023-09-18 VITALS — BP 137/75 | HR 72 | Temp 98.2°F | Resp 21 | Ht 65.0 in | Wt 101.0 lb

## 2023-09-18 DIAGNOSIS — J45909 Unspecified asthma, uncomplicated: Secondary | ICD-10-CM | POA: Diagnosis not present

## 2023-09-18 DIAGNOSIS — D125 Benign neoplasm of sigmoid colon: Secondary | ICD-10-CM

## 2023-09-18 DIAGNOSIS — Z1211 Encounter for screening for malignant neoplasm of colon: Secondary | ICD-10-CM | POA: Diagnosis not present

## 2023-09-18 MED ORDER — SODIUM CHLORIDE 0.9 % IV SOLN: 500.0000 mL | Freq: Once | INTRAVENOUS | Status: DC

## 2023-09-18 NOTE — Progress Notes (Signed)
 Pt's states no medical or surgical changes since previsit or office visit.

## 2023-09-18 NOTE — Patient Instructions (Signed)
 Please read handouts provided. Continue present medications. Await pathology results. Repeat colonoscopy in 5 years for screening. Resume previous diet.   YOU HAD AN ENDOSCOPIC PROCEDURE TODAY AT THE [facility]:   Refer to the procedure report that was given to you for any specific questions about what was found during the examination.  If the procedure report does not answer your questions, please call your gastroenterologist to clarify.  If you requested that your care partner not be given the details of your procedure findings, then the procedure report has been included in a sealed envelope for you to review at your convenience later.  YOU SHOULD EXPECT: Some feelings of bloating in the abdomen. Passage of more gas than usual.  Walking can help get rid of the air that was put into your GI tract during the procedure and reduce the bloating. If you had a lower endoscopy (such as a colonoscopy or flexible sigmoidoscopy) you may notice spotting of blood in your stool or on the toilet paper. If you underwent a bowel prep for your procedure, you may not have a normal bowel movement for a few days.  Please Note:  You might notice some irritation and congestion in your nose or some drainage.  This is from the oxygen used during your procedure.  There is no need for concern and it should clear up in a day or so.  SYMPTOMS TO REPORT IMMEDIATELY:  Following lower endoscopy (colonoscopy or flexible sigmoidoscopy):  Excessive amounts of blood in the stool  Significant tenderness or worsening of abdominal pains  Swelling of the abdomen that is new, acute  Fever of 100F or higher.  For urgent or emergent issues, a gastroenterologist can be reached at any hour by calling [phone]. Do not use MyChart messaging for urgent concerns.    DIET:  We do recommend a small meal at first, but then you may proceed to your regular diet.  Drink plenty of fluids but you should avoid alcoholic  beverages for 24 hours.  ACTIVITY:  You should plan to take it easy for the rest of today and you should NOT DRIVE or use heavy machinery until tomorrow (because of the sedation medicines used during the test).    FOLLOW UP: Our staff will call the number listed on your records the next business day following your procedure.  We will call around 7:15- 8:00 am to check on you and address any questions or concerns that you may have regarding the information given to you following your procedure. If we do not reach you, we will leave a message.     If any biopsies were taken you will be contacted by phone or by letter within the next 1-3 weeks.  Please call us  at [phone] if you have not heard about the biopsies in 3 weeks.    SIGNATURES/CONFIDENTIALITY: You and/or your care partner have signed paperwork which will be entered into your electronic medical record.  These signatures attest to the fact that that the information above on your After Visit Summary has been reviewed and is understood.  Full responsibility of the confidentiality of this discharge information lies with you and/or your care-partner.

## 2023-09-18 NOTE — Progress Notes (Signed)
 Report to PACU, RN, vss, BBS= Clear.

## 2023-09-18 NOTE — Op Note (Addendum)
 [facility] Patient Name: Jeanne Huynh Procedure Date: 09/18/2023 1:45 PM MRN: [id number] Endoscopist: Norleen SAILOR. Abran , MD, [id number] Age: 58 Referring MD:  Date of Birth: 04-30-1966 Gender: Female Account #: [id number] Procedure:                Colonoscopy with cold snare polypectomy x 3 Indications:              Screening for colorectal malignant neoplasm; weight                            loss Medicines:                Monitored Anesthesia Care Procedure:                Pre-Anesthesia Assessment:                           - Prior to the procedure, a History and Physical                            was performed, and patient medications and                            allergies were reviewed. The patient's tolerance of                            previous anesthesia was also reviewed. The risks                            and benefits of the procedure and the sedation                            options and risks were discussed with the patient.                            All questions were answered, and informed consent                            was obtained. Prior Anticoagulants: The patient has                            taken no anticoagulant or antiplatelet agents.                            After reviewing the risks and benefits, the patient                            was deemed in satisfactory condition to undergo the                            procedure.                           After obtaining informed consent, the colonoscope  was passed under direct vision. Throughout the                            procedure, the patient's blood pressure, pulse, and                            oxygen saturations were monitored continuously. The                            Olympus Scope [id number] was introduced through the                            anus and advanced to the the cecum, identified by                            appendiceal orifice and  ileocecal valve. The                            ileocecal valve, appendiceal orifice, and rectum                            were photographed. The quality of the bowel                            preparation was excellent. The colonoscopy was                            performed without difficulty. The patient tolerated                            the procedure well. The bowel preparation used was                            SUPREP via split dose instruction. Scope In: 2:05:16 PM Scope Out: 2:19:23 PM Scope Withdrawal Time: 0 hours 10 minutes 44 seconds  Total Procedure Duration: 0 hours 14 minutes 7 seconds  Findings:                 Three polyps were found in the sigmoid colon. The                            polyps were 4 to 6 mm in size. These polyps were                            removed with a cold snare. Resection and retrieval                            were complete.                           The exam was otherwise without abnormality on                            direct and retroflexion views. Complications:  No immediate complications. Estimated blood loss:                            None. Estimated Blood Loss:     Estimated blood loss: none. Impression:               - Three 4 to 6 mm polyps in the sigmoid colon,                            removed with a cold snare. Resected and retrieved.                           - The examination was otherwise normal on direct                            and retroflexion views. Recommendation:           - Repeat colonoscopy in 5 years for surveillance.                           - Patient has a contact number available for                            emergencies. The signs and symptoms of potential                            delayed complications were discussed with the                            patient. Return to normal activities tomorrow.                            Written discharge instructions were provided to the                             patient.                           - Resume previous diet.                           - Continue present medications.                           - Await pathology results. Norleen SAILOR. Abran, MD 09/18/2023 2:24:23 PM This report has been signed electronically.

## 2023-09-18 NOTE — Progress Notes (Signed)
Pt denies being on weight loss meds or blood thinner meds

## 2023-09-18 NOTE — Progress Notes (Signed)
 Called to room to assist during endoscopic procedure.  Patient ID and intended procedure confirmed with present staff. Received instructions for my participation in the procedure from the performing physician.

## 2023-09-18 NOTE — Progress Notes (Signed)
 HISTORY OF PRESENT ILLNESS:  Jeanne Huynh is a 58 y.o. female sent for routine screening colonoscopy  REVIEW OF SYSTEMS:  All non-GI ROS negative except for  Past Medical History:  Diagnosis Date   Asthma    Enlarged kidney    Kidney stone     Past Surgical History:  Procedure Laterality Date   rt ankle surgery w/plate & screws Right     Social History Jeanne Huynh  reports that she has been smoking cigarettes. She has never used smokeless tobacco. She reports current alcohol use. No history on file for drug use.  family history includes Cancer in her mother; Heart failure in her father.  Allergies  Allergen Reactions   Gabapentin  Other (See Comments)    Burning and tingling   Aspirin Other (See Comments)    made me have a asthma attack   Codeine Nausea Only   Motrin [Ibuprofen] Nausea Only    Cant take high amounts   Penicillins Other (See Comments)    lost 10 pounds while taking it Has patient had a PCN reaction causing immediate rash, facial/tongue/throat swelling, SOB or lightheadedness with hypotension: No Has patient had a PCN reaction causing severe rash involving mucus membranes or skin necrosis: No Has patient had a PCN reaction that required hospitalization No Has patient had a PCN reaction occurring within the last 10 years: No If all of the above answers are NO, then may proceed with Cephalosporin use.        PHYSICAL EXAMINATION: Vital signs: BP (!) 158/90   Pulse 94   Temp 98.2 F (36.8 C)   Resp 13   Ht 5' 5 (1.651 m)   Wt 101 lb (45.8 kg)   LMP 05/04/2013   SpO2 99%   BMI 16.81 kg/m  General: Well-developed, well-nourished, no acute distress HEENT: Sclerae are anicteric, conjunctiva pink. Oral mucosa intact Lungs: Clear Heart: Regular Abdomen: soft, nontender, nondistended, no obvious ascites, no peritoneal signs, normal bowel sounds. No organomegaly. Extremities: No edema Psychiatric: alert and oriented x3.  Cooperative     ASSESSMENT:   Colon cancer screening  PLAN:  Screening colonoscopy

## 2023-09-19 ENCOUNTER — Telehealth: Payer: Self-pay | Admitting: *Deleted

## 2023-09-19 NOTE — Telephone Encounter (Signed)
  Follow up Call-     09/18/2023    1:19 PM  Call back number  Post procedure Call Back phone  # [phone]  Permission to leave phone message Yes     Patient questions:  Do you have a fever, pain , or abdominal swelling? No. Pain Score  0 *  Have you tolerated food without any problems? Yes.    Have you been able to return to your normal activities? Yes.    Do you have any questions about your discharge instructions: Diet   No. Medications  No. Follow up visit  No.  Do you have questions or concerns about your Care? No.  Actions: * If pain score is 4 or above: No action needed, pain <4.

## 2023-09-21 ENCOUNTER — Encounter: Payer: Self-pay | Admitting: Internal Medicine

## 2023-09-21 LAB — SURGICAL PATHOLOGY

## 2023-09-24 ENCOUNTER — Ambulatory Visit: Payer: Medicaid Other | Admitting: Student

## 2023-09-24 NOTE — Progress Notes (Deleted)
  SUBJECTIVE:   CHIEF COMPLAINT / HPI:   Unintentional Weight Loss -Seen 07/10/23 w/ improved weight at 101 (98)  PERTINENT  PMH / PSH: ***  Past Medical History:  Diagnosis Date   Asthma    Enlarged kidney    Kidney stone    OBJECTIVE:  LMP 05/04/2013  Physical Exam   ASSESSMENT/PLAN:   Assessment & Plan  No follow-ups on file. Bess Kinds, MD 09/24/2023, 6:43 AM PGY-***, [facility] {    This will disappear when note is signed, click to select method of visit    :1}

## 2023-12-07 ENCOUNTER — Encounter: Payer: Self-pay | Admitting: Student

## 2023-12-07 ENCOUNTER — Ambulatory Visit: Admitting: Student

## 2023-12-07 VITALS — BP 118/80 | HR 71 | Ht 66.0 in | Wt 100.2 lb

## 2023-12-07 DIAGNOSIS — R634 Abnormal weight loss: Secondary | ICD-10-CM | POA: Diagnosis not present

## 2023-12-07 DIAGNOSIS — L02213 Cutaneous abscess of chest wall: Secondary | ICD-10-CM

## 2023-12-07 DIAGNOSIS — L723 Sebaceous cyst: Secondary | ICD-10-CM | POA: Insufficient documentation

## 2023-12-07 MED ORDER — DOXYCYCLINE HYCLATE 100 MG PO TABS: 100.0000 mg | ORAL_TABLET | Freq: Two times a day (BID) | ORAL | 0 refills | Status: AC

## 2023-12-07 NOTE — Patient Instructions (Signed)
 It was great to see you! Thank you for allowing me to participate in your care!  I recommend that you always bring your medications to each appointment as this makes it easy to ensure we are on the correct medications and helps us  not miss when refills are needed.  Our plans for today:  - Weight Management  Continue to eat 3 balanced meals a day  Drink 2 Ensure shakes a day and snack between meals   - Chest wall abscess  I'm going to give you an antibiotic to take twice a day, for the next 10 days  Follow up in our Skin clinic on Wednesday    Take care and seek immediate care sooner if you develop any concerns.   Dr. Wilhemena Harbour, MD [facility]

## 2023-12-07 NOTE — Assessment & Plan Note (Addendum)
 Patient comes in for follow-up of her weight.  Patient reports that she is not able to eat lunch every day but is working on eating more frequently/regularly.  Patient weight stable from last time.  Patient reports eating 2 meals a day, and snacking in between meals.  Will recommend patient eat 3 meals a day, and drink Ensure shakes between meals. - 3 balanced meals a day - 2 ensures a day -Snacks between meals, (nuts, chips, ect)

## 2023-12-07 NOTE — Progress Notes (Cosign Needed Addendum)
  SUBJECTIVE:   CHIEF COMPLAINT / HPI:   Weight Management -Patient w/ low weight and problems gaining. Last seen 11/26 and had gained 3 lbs -Recommend 3 good balanced meals, snacking, and ensure shakes  Chest Wall mass/cyst Has been irritated since Tuesday and got red/swollen and was draining. Drained it with a needle, but it's still swollen. The cyst has come up twice, but the lesion initially started as a black head. Has been a black head for years, but been bothering her more recently.   PERTINENT  PMH / PSH:   OBJECTIVE:  BP 118/80   Pulse 71   Ht 5\' 6"  (1.676 m)   Wt 100 lb 3.2 oz (45.5 kg)   LMP 05/04/2013   SpO2 100%   BMI 16.17 kg/m  Physical Exam Exam conducted with a chaperone present.  Chest:     Chest wall: Mass, swelling and tenderness present.     Comments: Abscess on chest wall, measuring 2-3 cm in diameter with puss draining out.       ASSESSMENT/PLAN:   Assessment & Plan Abscess of chest wall Patient comes in for concern of abscess located on chest wall.  Patient appreciates that started off as a blackhead, and then swelled up, got red and painful, was difficult to breathe secondary to pain and pressure.  She lanced it last night and it has felt better since then, and was better able to breathe.  Patient appears to have abscess, that is actively draining on chest wall.  Will treat with oral antibiotics, and I&D.  Patient to have I&D done Wednesday morning during skin clinic. - doxycyline 100 mg twice daily x 10 days - Patient to follow-up in skin clinic Unintentional weight loss Patient comes in for follow-up of her weight.  Patient reports that she is not able to eat lunch every day but is working on eating more frequently/regularly.  Patient weight stable from last time.  Patient reports eating 2 meals a day, and snacking in between meals.  Will recommend patient eat 3 meals a day, and drink Ensure shakes between meals. - 3 balanced meals a day - 2  ensures a day -Snacks between meals, (nuts, chips, ect) No follow-ups on file. Wilhemena Harbour, MD 12/07/2023, 4:08 PM PGY-3, [facility]

## 2023-12-07 NOTE — Assessment & Plan Note (Addendum)
 Patient comes in for concern of abscess located on chest wall.  Patient appreciates that started off as a blackhead, and then swelled up, got red and painful, was difficult to breathe secondary to pain and pressure.  She lanced it last night and it has felt better since then, and was better able to breathe.  Patient appears to have abscess, that is actively draining on chest wall.  Will treat with oral antibiotics, and I&D.  Patient to have I&D done Wednesday morning during skin clinic. - doxycyline 100 mg twice daily x 10 days - Patient to follow-up in skin clinic

## 2023-12-12 ENCOUNTER — Ambulatory Visit: Payer: Self-pay | Admitting: Student

## 2023-12-12 VITALS — BP 130/70 | HR 81 | Ht 66.0 in | Wt 98.8 lb

## 2023-12-12 DIAGNOSIS — L723 Sebaceous cyst: Secondary | ICD-10-CM | POA: Diagnosis not present

## 2023-12-12 NOTE — Progress Notes (Signed)
    SUBJECTIVE:   CHIEF COMPLAINT / HPI:   The patient presents with a recurrent abscess in the axillary region. She has a recurrent sebaceous cyst in the axillary region, which she manages by expressing black material when bothersome.  It recently became infected and turned into an abscess which she self drained at home. She was prescribed a ten-day course of antibiotics 5 days ago and has been taking them as directed. The area was warm to touch, but she did not check her temperature for fever. She notes peeling and itching of the skin, which she associates with healing. No current fever is reported. She has had a similar cyst removed in a different location years ago.  PERTINENT  PMH / PSH: None pertinent  OBJECTIVE:   BP 130/70   Pulse 81   Ht 5\' 6"  (1.676 m)   Wt 98 lb 12.8 oz (44.8 kg)   LMP 05/04/2013   SpO2 97%   BMI 15.95 kg/m    General: NAD, pleasant, well-appearing Cardiac: Well-perfused Respiratory: Normal effort Skin: Healing skin lesion under right axilla with surrounding hyperpigmentation and scaling.  No warmth, no drainage. See photo Neuro: alert, no obvious focal deficits Psych: Normal affect and mood   ASSESSMENT/PLAN:   Sebaceous cyst Recurrent cyst in axillary region, recently became infected with formation of an abscess which self drained and is being treated with antibiotics. Currently healing, no longer draining. -Complete 10-day course of doxycycline  - Monitor for infection or recurrence.  - Consider surgical removal of cyst post-infection, can coordinate with dermatology clinic     Dr. Glenn Lange, DO [facility]

## 2023-12-12 NOTE — Patient Instructions (Signed)
 It was great to see you! Thank you for allowing me to participate in your care!    Our plans for today:  - Continue course of antibiotics - return if symptoms worsen - Once infection is clear and the cyst fills up again, if you want it removed, let your PCP know and we can get your scheduled in dermatology clinic   Take care and seek immediate care sooner if you develop any concerns.   Dr. Glenn Lange, DO [facility]

## 2023-12-12 NOTE — Assessment & Plan Note (Addendum)
 Recurrent cyst in axillary region, recently became infected with formation of an abscess which self drained and is being treated with antibiotics. Currently healing, no longer draining. -Complete 10-day course of doxycycline  - Monitor for infection or recurrence.  - Consider surgical removal of cyst post-infection, can coordinate with dermatology clinic

## 2023-12-14 ENCOUNTER — Other Ambulatory Visit: Payer: Self-pay | Admitting: Student

## 2024-01-04 ENCOUNTER — Other Ambulatory Visit: Payer: Self-pay

## 2024-01-04 MED ORDER — ALBUTEROL SULFATE HFA 108 (90 BASE) MCG/ACT IN AERS: 2.0000 | INHALATION_SPRAY | RESPIRATORY_TRACT | 0 refills | Status: AC | PRN

## 2024-01-22 ENCOUNTER — Encounter: Payer: Self-pay | Admitting: *Deleted

## 2024-04-25 ENCOUNTER — Ambulatory Visit: Admitting: Family Medicine

## 2024-06-24 ENCOUNTER — Other Ambulatory Visit: Payer: Self-pay | Admitting: *Deleted

## 2024-06-24 MED ORDER — BACLOFEN 10 MG PO TABS: 10.0000 mg | ORAL_TABLET | Freq: Three times a day (TID) | ORAL | 0 refills | Status: AC | PRN

## 2024-06-24 NOTE — Telephone Encounter (Signed)
 Chart reviewed. Appears like patient on chronic baclofen  for back pain. Given change in PCP, will request patient to be evaluated in clinic prior to next refill.

## 2024-07-07 ENCOUNTER — Encounter: Payer: Self-pay | Admitting: Family Medicine

## 2024-07-07 ENCOUNTER — Ambulatory Visit: Admitting: Family Medicine

## 2024-07-07 VITALS — BP 138/82 | HR 104 | Ht 66.0 in | Wt 102.8 lb

## 2024-07-07 DIAGNOSIS — M79604 Pain in right leg: Secondary | ICD-10-CM | POA: Diagnosis not present

## 2024-07-07 DIAGNOSIS — M545 Low back pain, unspecified: Secondary | ICD-10-CM

## 2024-07-07 DIAGNOSIS — M79605 Pain in left leg: Secondary | ICD-10-CM

## 2024-07-07 MED ORDER — GABAPENTIN 100 MG PO CAPS: 100.0000 mg | ORAL_CAPSULE | Freq: Three times a day (TID) | ORAL | 3 refills | Status: AC | PRN

## 2024-07-07 MED ORDER — DICLOFENAC SODIUM 1 % EX GEL: 2.0000 g | Freq: Four times a day (QID) | CUTANEOUS | 2 refills | Status: AC

## 2024-07-07 NOTE — Progress Notes (Unsigned)
    SUBJECTIVE:   CHIEF COMPLAINT / HPI:   Years of low back pain  No recent falls or injuries No bladder or bowel incontinence No saddle paresthesia  Knee pain   Works at [employer], lots of heavy lifting   Open to trying gabapentin  again   PERTINENT  PMH / PSH: ***  OBJECTIVE:   BP 138/82   Pulse (!) 104   Ht 5' 6 (1.676 m)   Wt 102 lb 12.8 oz (46.6 kg)   LMP 05/04/2013   SpO2 95%   BMI 16.59 kg/m   ***  ASSESSMENT/PLAN:   Assessment & Plan Leg pain, bilateral  Lumbar back pain      Jeanne Cassis, MD [facility]

## 2024-07-07 NOTE — Patient Instructions (Addendum)
 Thank you for visiting clinic today and allowing us  to participate in your care!  For your back pain, we are referring you to the Physical Medicine and Rehab (PM&R) specialists who can further evaluate and treat your condition. You should hear from someone in the next few weeks about scheduling an appointment.   For your burning pain, you may use gabapentin  up to 3 times a day. If you feel too drowsy, please stop taking and let us  know.   For your knee pain, please try using voltaren  gel to help.   Please schedule an appointment if your symptoms are not improving.   Reach out any time with any questions or concerns you may have - we are here for you!  Damien Cassis, MD [facility] [phone]

## 2024-07-08 NOTE — Assessment & Plan Note (Signed)
 Lumbar x-ray in 2022 with lumbar spondylosis.  No recent knee imaging.  Exam today reassuring against acute bony changes.  Suspect symptoms secondary to chronic degeneration and possible overuse from work. - Referral to PM&R for further lumbar pain evaluation and treatment.  - Gabapentin  100mg   3 times daily as needed.  Removed previously documented intolerance to gabapentin  upon clarification with patient. - Voltaren  gel, lidocaine  patches as needed

## 2024-07-28 ENCOUNTER — Encounter: Payer: Self-pay | Admitting: Physical Medicine and Rehabilitation

## 2024-09-08 ENCOUNTER — Encounter: Admitting: Physical Medicine and Rehabilitation

## 2024-09-09 NOTE — Progress Notes (Unsigned)
 "  Subjective:    Patient ID: Jeanne Huynh, female    DOB: Dec 12, 1965, 59 y.o.   MRN: [id number]  HPI   Pain Inventory Average Pain {NUMBERS; 0-10:5044} Pain Right Now {NUMBERS; 0-10:5044} My pain is {PAIN DESCRIPTION:21022940}  In the last 24 hours, has pain interfered with the following? General activity {NUMBERS; 0-10:5044} Relation with others {NUMBERS; 0-10:5044} Enjoyment of life {NUMBERS; 0-10:5044} What TIME of day is your pain at its worst? {time of day:24191} Sleep (in general) {BHH GOOD/FAIR/POOR:22877}  Pain is worse with: {ACTIVITIES:21022942} Pain improves with: {PAIN IMPROVES TPUY:78977056} Relief from Meds: {NUMBERS; 0-10:5044}  {MOBILITY JIO:78977055}  {FUNCTION:21022946}  {NEURO/PSYCH:21022948}  {CPRM PRIOR STUDIES:21022953}  {CPRM PHYSICIANS INVOLVED IN YOUR CARE:21022954}    Family History  Problem Relation Age of Onset   Cancer Mother    Heart failure Father    Colon cancer Neg Hx    Rectal cancer Neg Hx    Stomach cancer Neg Hx    Social History   Socioeconomic History   Marital status: Married    Spouse name: Not on file   Number of children: Not on file   Years of education: Not on file   Highest education level: Not on file  Occupational History   Not on file  Tobacco Use   Smoking status: Every Day    Current packs/day: 2.00    Types: Cigarettes   Smokeless tobacco: Never  Substance and Sexual Activity   Alcohol use: Yes   Drug use: Not on file   Sexual activity: Yes  Other Topics Concern   Not on file  Social History Narrative   Not on file   Social Drivers of Health   Tobacco Use: High Risk (07/07/2024)   Patient History    Smoking Tobacco Use: Every Day    Smokeless Tobacco Use: Never    Passive Exposure: Not on file  Financial Resource Strain: Not on file  Food Insecurity: Not on file  Transportation Needs: Not on file  Physical Activity: Not on file  Stress: Not on file  Social Connections: Not on file   Depression (PHQ2-9): Medium Risk (07/07/2024)   Depression (PHQ2-9)    PHQ-2 Score: 9  Alcohol Screen: Not on file  Housing: Not on file  Utilities: Not on file  Health Literacy: Not on file   Past Surgical History:  Procedure Laterality Date   rt ankle surgery w/plate & screws Right    Past Medical History:  Diagnosis Date   Asthma    Enlarged kidney    Kidney stone    LMP 05/04/2013   Opioid Risk Score:   Fall Risk Score:  `1  Depression screen PHQ 2/9     07/07/2024    3:20 PM 12/07/2023    3:48 PM 07/10/2023    2:42 PM 03/05/2023    3:07 PM 07/26/2021   11:50 AM 03/28/2021    3:03 PM 03/07/2021    3:42 PM  Depression screen PHQ 2/9  Decreased Interest 1 2 1 1 2 2 2   Down, Depressed, Hopeless 1 1 1 1 1 1 2   PHQ - 2 Score 2 3 2 2 3 3 4   Altered sleeping 1 1 1  0 2 1 2   Tired, decreased energy 2 2 1 2 2 2 2   Change in appetite 1 1 1  0 0 0 1  Feeling bad or failure about yourself  0 0 0 0 0 1 1  Trouble concentrating 2 2 1 1  0 0 0  Moving slowly or fidgety/restless 1 1 1 1 2 2 2   Suicidal thoughts 0 0 0 0 0 0 0  PHQ-9 Score 9 10  7  6  9  9  12    Difficult doing work/chores Somewhat difficult   Somewhat difficult Somewhat difficult Somewhat difficult Somewhat difficult     Data saved with a previous flowsheet row definition    Review of Systems     Objective:   Physical Exam        Assessment & Plan:    "
